# Patient Record
Sex: Female | Born: 1965 | Race: White | Hispanic: No | Marital: Married | Smoking: Never smoker
Health system: Southern US, Community
[De-identification: ages and names within clinical notes are randomized; demographics above are authoritative.]

## PROBLEM LIST (undated history)

## (undated) DIAGNOSIS — F410 Panic disorder [episodic paroxysmal anxiety] without agoraphobia: Secondary | ICD-10-CM

## (undated) DIAGNOSIS — D649 Anemia, unspecified: Secondary | ICD-10-CM

## (undated) DIAGNOSIS — J329 Chronic sinusitis, unspecified: Secondary | ICD-10-CM

## (undated) DIAGNOSIS — M1990 Unspecified osteoarthritis, unspecified site: Secondary | ICD-10-CM

## (undated) DIAGNOSIS — N809 Endometriosis, unspecified: Secondary | ICD-10-CM

## (undated) DIAGNOSIS — F329 Major depressive disorder, single episode, unspecified: Secondary | ICD-10-CM

## (undated) DIAGNOSIS — F32A Depression, unspecified: Secondary | ICD-10-CM

## (undated) HISTORY — PX: TUBAL LIGATION: SHX77

---
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
## 1998-05-02 ENCOUNTER — Encounter: Admission: RE | Admit: 1998-05-02 | Discharge: 1998-07-31 | Payer: Self-pay | Admitting: Obstetrics and Gynecology

## 1998-10-21 ENCOUNTER — Inpatient Hospital Stay (HOSPITAL_COMMUNITY): Admission: AD | Admit: 1998-10-21 | Discharge: 1998-10-21 | Payer: Self-pay | Admitting: Obstetrics and Gynecology

## 1998-12-05 ENCOUNTER — Inpatient Hospital Stay (HOSPITAL_COMMUNITY): Admission: AD | Admit: 1998-12-05 | Discharge: 1998-12-07 | Payer: Self-pay | Admitting: Gynecology

## 1999-01-15 ENCOUNTER — Other Ambulatory Visit: Admission: RE | Admit: 1999-01-15 | Discharge: 1999-01-15 | Payer: Self-pay | Admitting: Gynecology

## 1999-09-04 ENCOUNTER — Ambulatory Visit (HOSPITAL_COMMUNITY): Admission: RE | Admit: 1999-09-04 | Discharge: 1999-09-04 | Payer: Self-pay | Admitting: Gynecology

## 2000-02-18 ENCOUNTER — Other Ambulatory Visit: Admission: RE | Admit: 2000-02-18 | Discharge: 2000-02-18 | Payer: Self-pay | Admitting: Gynecology

## 2000-06-04 ENCOUNTER — Inpatient Hospital Stay (HOSPITAL_COMMUNITY): Admission: AD | Admit: 2000-06-04 | Discharge: 2000-06-04 | Payer: Self-pay | Admitting: *Deleted

## 2000-06-04 ENCOUNTER — Encounter: Payer: Self-pay | Admitting: *Deleted

## 2000-08-25 ENCOUNTER — Inpatient Hospital Stay (HOSPITAL_COMMUNITY): Admission: AD | Admit: 2000-08-25 | Discharge: 2000-08-25 | Payer: Self-pay | Admitting: Gynecology

## 2000-08-25 ENCOUNTER — Encounter: Payer: Self-pay | Admitting: Gynecology

## 2000-08-27 ENCOUNTER — Inpatient Hospital Stay (HOSPITAL_COMMUNITY): Admission: AD | Admit: 2000-08-27 | Discharge: 2000-08-29 | Payer: Self-pay | Admitting: Gynecology

## 2000-10-15 ENCOUNTER — Other Ambulatory Visit: Admission: RE | Admit: 2000-10-15 | Discharge: 2000-10-15 | Payer: Self-pay | Admitting: Gynecology

## 2002-11-08 ENCOUNTER — Other Ambulatory Visit: Admission: RE | Admit: 2002-11-08 | Discharge: 2002-11-08 | Payer: Self-pay | Admitting: Obstetrics and Gynecology

## 2003-03-16 ENCOUNTER — Encounter: Admission: RE | Admit: 2003-03-16 | Discharge: 2003-03-16 | Payer: Self-pay | Admitting: Obstetrics and Gynecology

## 2003-11-28 ENCOUNTER — Other Ambulatory Visit: Admission: RE | Admit: 2003-11-28 | Discharge: 2003-11-28 | Payer: Self-pay | Admitting: Obstetrics and Gynecology

## 2004-12-12 ENCOUNTER — Other Ambulatory Visit: Admission: RE | Admit: 2004-12-12 | Discharge: 2004-12-12 | Payer: Self-pay | Admitting: Obstetrics and Gynecology

## 2005-04-06 ENCOUNTER — Ambulatory Visit (HOSPITAL_COMMUNITY): Admission: RE | Admit: 2005-04-06 | Discharge: 2005-04-06 | Payer: Self-pay | Admitting: Obstetrics and Gynecology

## 2008-10-13 ENCOUNTER — Emergency Department: Payer: Self-pay | Admitting: Emergency Medicine

## 2011-03-10 ENCOUNTER — Ambulatory Visit: Payer: Self-pay

## 2012-03-15 ENCOUNTER — Ambulatory Visit: Payer: Self-pay

## 2014-08-30 ENCOUNTER — Other Ambulatory Visit: Payer: Self-pay | Admitting: Obstetrics and Gynecology

## 2014-08-30 DIAGNOSIS — Z1231 Encounter for screening mammogram for malignant neoplasm of breast: Secondary | ICD-10-CM

## 2014-08-31 ENCOUNTER — Ambulatory Visit
Admission: RE | Admit: 2014-08-31 | Discharge: 2014-08-31 | Disposition: A | Discharge status: Home / Self Care | Payer: BC Managed Care – PPO | Source: Ambulatory Visit | Attending: Obstetrics and Gynecology | Admitting: Obstetrics and Gynecology

## 2014-08-31 DIAGNOSIS — Z1231 Encounter for screening mammogram for malignant neoplasm of breast: Secondary | ICD-10-CM | POA: Diagnosis present

## 2015-09-25 ENCOUNTER — Other Ambulatory Visit: Payer: Self-pay | Admitting: Obstetrics and Gynecology

## 2015-09-25 DIAGNOSIS — Z1231 Encounter for screening mammogram for malignant neoplasm of breast: Secondary | ICD-10-CM

## 2015-10-17 ENCOUNTER — Ambulatory Visit: Payer: BC Managed Care – PPO

## 2015-10-21 ENCOUNTER — Ambulatory Visit
Admission: RE | Admit: 2015-10-21 | Discharge: 2015-10-21 | Disposition: A | Discharge status: Home / Self Care | Payer: BC Managed Care – PPO | Source: Ambulatory Visit | Attending: Obstetrics and Gynecology | Admitting: Obstetrics and Gynecology

## 2015-10-21 DIAGNOSIS — R928 Other abnormal and inconclusive findings on diagnostic imaging of breast: Secondary | ICD-10-CM | POA: Diagnosis not present

## 2015-10-21 DIAGNOSIS — Z1231 Encounter for screening mammogram for malignant neoplasm of breast: Secondary | ICD-10-CM | POA: Diagnosis present

## 2015-10-24 ENCOUNTER — Other Ambulatory Visit: Payer: Self-pay | Admitting: Obstetrics and Gynecology

## 2015-10-24 DIAGNOSIS — N6489 Other specified disorders of breast: Secondary | ICD-10-CM

## 2015-11-06 ENCOUNTER — Ambulatory Visit
Admission: RE | Admit: 2015-11-06 | Discharge: 2015-11-06 | Disposition: A | Discharge status: Home / Self Care | Payer: BC Managed Care – PPO | Source: Ambulatory Visit | Attending: Obstetrics and Gynecology | Admitting: Obstetrics and Gynecology

## 2015-11-06 DIAGNOSIS — N6489 Other specified disorders of breast: Secondary | ICD-10-CM

## 2016-04-12 ENCOUNTER — Emergency Department: Payer: BC Managed Care – PPO

## 2016-04-12 ENCOUNTER — Emergency Department
Admission: EM | Admit: 2016-04-12 | Discharge: 2016-04-12 | Disposition: A | Discharge status: Home / Self Care | Payer: BC Managed Care – PPO | Attending: Emergency Medicine | Admitting: Emergency Medicine

## 2016-04-12 DIAGNOSIS — R079 Chest pain, unspecified: Secondary | ICD-10-CM | POA: Insufficient documentation

## 2016-04-12 LAB — BASIC METABOLIC PANEL
ANION GAP: 5 (ref 5–15)
BUN: 14 mg/dL (ref 6–20)
CHLORIDE: 103 mmol/L (ref 101–111)
CO2: 30 mmol/L (ref 22–32)
CREATININE: 0.71 mg/dL (ref 0.44–1.00)
Calcium: 9.1 mg/dL (ref 8.9–10.3)
GFR calc non Af Amer: >60 mL/min (ref >60)
GLUCOSE: 94 mg/dL (ref 65–99)
Potassium: 3.8 mmol/L (ref 3.5–5.1)
Sodium: 138 mmol/L (ref 135–145)

## 2016-04-12 LAB — CBC
HCT: 41.6 % (ref 35.0–47.0)
HEMOGLOBIN: 14.3 g/dL (ref 12.0–16.0)
MCH: 29.6 pg (ref 26.0–34.0)
MCHC: 34.4 g/dL (ref 32.0–36.0)
MCV: 86.1 fL (ref 80.0–100.0)
Platelets: 183 10*3/uL (ref 150–440)
RBC: 4.83 MIL/uL (ref 3.80–5.20)
RDW: 13.6 % (ref 11.5–14.5)
WBC: 7.9 10*3/uL (ref 3.6–11.0)

## 2016-04-12 LAB — TROPONIN I: Troponin I: <0.03 ng/mL (ref <0.03)

## 2016-04-12 NOTE — ED Triage Notes (Signed)
Patient reports chest pain off and on since Thursday.  Reports was going to go to walk in clinic but they had closed earlier.  Tonight reports having pain to upper chest into left arm.

## 2016-04-12 NOTE — Discharge Instructions (Signed)
Please seek medical attention for any high fevers, chest pain, shortness of breath, change in behavior, persistent vomiting, bloody stool or any other new or concerning symptoms.  

## 2016-04-12 NOTE — ED Provider Notes (Signed)
[facility] Emergency Department Provider Note   ____________________________________________   I have reviewed the triage vital signs and the nursing notes.   HISTORY  Chief Complaint Chest Pain   History limited by: Not Limited   HPI Jo Lee is a 51 y.o. female who presents to the emergency department today he comes of concerns for chest pain. The chest pain started 3 days ago. Has been intermittent. It is located in the left chest. It has occasionally gone to her left arm. It has not been associated with any vomiting although she has had some nausea with it. No shortness of breath. Patient is not noticing particular pattern to the pain. She denies any fevers. Denies similar pain in the past.  No past medical history on file.  There are no active problems to display for this patient.   No past surgical history on file.  Prior to Admission medications   Not on File    Allergies Codeine and Sulfa antibiotics  No family history on file.  Social History Social History  Substance Use Topics  . Smoking status: Not on file  . Smokeless tobacco: Not on file  . Alcohol use Not on file    Review of Systems  Constitutional: Negative for fever. Cardiovascular: Negative for chest pain. Respiratory: Negative for shortness of breath. Gastrointestinal: Negative for abdominal pain, vomiting and diarrhea. Neurological: Negative for headaches, focal weakness or numbness.  10-point ROS otherwise negative.  ____________________________________________   PHYSICAL EXAM:  VITAL SIGNS: ED Triage Vitals [04/12/16 0300]  Enc Vitals Group     BP 118/76     Pulse Rate 70     Resp 20     Temp 98.4 F (36.9 C)     Temp Source Oral     SpO2 100 %     Weight 170 lb (77.1 kg)     Height  (1.753 m)   Constitutional: Alert and oriented. Well appearing and in no distress. Eyes: Conjunctivae are normal. Normal extraocular movements. ENT    Head: Normocephalic and atraumatic.   Nose: No congestion/rhinnorhea.   Mouth/Throat: Mucous membranes are moist.   Neck: No stridor. Hematological/Lymphatic/Immunilogical: No cervical lymphadenopathy. Cardiovascular: Normal rate, regular rhythm.  No murmurs, rubs, or gallops.  Respiratory: Normal respiratory effort without tachypnea nor retractions. Breath sounds are clear and equal bilaterally. No wheezes/rales/rhonchi. Gastrointestinal: Soft and non tender. No rebound. No guarding.  Genitourinary: Deferred Musculoskeletal: Normal range of motion in all extremities. No lower extremity edema. Neurologic:  Normal speech and language. No gross focal neurologic deficits are appreciated.  Skin:  Skin is warm, dry and intact. No rash noted. Psychiatric: Mood and affect are normal. Speech and behavior are normal. Patient exhibits appropriate insight and judgment.  ____________________________________________    LABS (pertinent positives/negatives)  Labs Reviewed  BASIC METABOLIC PANEL  CBC  TROPONIN I     ____________________________________________   EKG  I, Phineas Semen, attending physician, personally viewed and interpreted this EKG  EKG Time: 0302 Rate: 64 Rhythm: normal sinus rhythm Axis: normal Intervals: qtc 451 QRS: narrow ST changes: no st elevation Impression: normal ekg ____________________________________________    RADIOLOGY  CXR IMPRESSION:  No acute cardiopulmonary process seen.   ____________________________________________   PROCEDURES  Procedures  ____________________________________________   INITIAL IMPRESSION / ASSESSMENT AND PLAN / ED COURSE  Pertinent labs & imaging results that were available during my care of the patient were reviewed by me and considered in my medical decision making (see chart  for details).  Patient presented to the emergency department today with intermittent chest pain for the past 3 days. Workup  here without any concerning elevation troponin. I would expect this to be elevated if it represented ACS. Additionally chest x-ray and blood work without any other concerning findings. This point feel patient is safe for follow-up with her primary care.  ____________________________________________   FINAL CLINICAL IMPRESSION(S) / ED DIAGNOSES  Final diagnoses:  Chest pain, unspecified type     Note: This dictation was prepared with Dragon dictation. Any transcriptional errors that result from this process are unintentional     Phineas Semen, MD 04/12/16 [phone]

## 2016-09-30 ENCOUNTER — Other Ambulatory Visit: Payer: Self-pay | Admitting: Obstetrics and Gynecology

## 2016-11-05 ENCOUNTER — Other Ambulatory Visit: Payer: Self-pay | Admitting: Obstetrics and Gynecology

## 2016-11-05 DIAGNOSIS — N6320 Unspecified lump in the left breast, unspecified quadrant: Secondary | ICD-10-CM

## 2016-11-09 ENCOUNTER — Other Ambulatory Visit: Payer: Self-pay | Admitting: Obstetrics and Gynecology

## 2016-11-09 DIAGNOSIS — N6320 Unspecified lump in the left breast, unspecified quadrant: Secondary | ICD-10-CM

## 2016-11-13 ENCOUNTER — Ambulatory Visit
Admission: RE | Admit: 2016-11-13 | Discharge: 2016-11-13 | Disposition: A | Discharge status: Home / Self Care | Payer: BC Managed Care – PPO | Source: Ambulatory Visit | Attending: Obstetrics and Gynecology | Admitting: Obstetrics and Gynecology

## 2016-11-13 DIAGNOSIS — N6320 Unspecified lump in the left breast, unspecified quadrant: Secondary | ICD-10-CM

## 2016-11-13 DIAGNOSIS — R918 Other nonspecific abnormal finding of lung field: Secondary | ICD-10-CM | POA: Diagnosis not present

## 2017-11-11 ENCOUNTER — Other Ambulatory Visit: Payer: Self-pay | Admitting: Obstetrics and Gynecology

## 2017-11-11 DIAGNOSIS — Z1231 Encounter for screening mammogram for malignant neoplasm of breast: Secondary | ICD-10-CM

## 2017-12-07 ENCOUNTER — Ambulatory Visit
Admission: RE | Admit: 2017-12-07 | Discharge: 2017-12-07 | Disposition: A | Discharge status: Home / Self Care | Payer: BC Managed Care – PPO | Source: Ambulatory Visit | Attending: Obstetrics and Gynecology | Admitting: Obstetrics and Gynecology

## 2017-12-07 DIAGNOSIS — Z1231 Encounter for screening mammogram for malignant neoplasm of breast: Secondary | ICD-10-CM | POA: Insufficient documentation

## 2017-12-13 ENCOUNTER — Ambulatory Visit: Payer: Self-pay | Admitting: Urology

## 2017-12-21 ENCOUNTER — Ambulatory Visit: Payer: Self-pay | Admitting: Urology

## 2017-12-21 ENCOUNTER — Encounter: Payer: Self-pay | Admitting: Urology

## 2018-07-08 DIAGNOSIS — E538 Deficiency of other specified B group vitamins: Secondary | ICD-10-CM | POA: Insufficient documentation

## 2018-07-08 DIAGNOSIS — E559 Vitamin D deficiency, unspecified: Secondary | ICD-10-CM | POA: Insufficient documentation

## 2018-11-10 ENCOUNTER — Other Ambulatory Visit
Admission: RE | Admit: 2018-11-10 | Discharge: 2018-11-10 | Disposition: A | Discharge status: Home / Self Care | Payer: BC Managed Care – PPO | Source: Ambulatory Visit | Attending: Internal Medicine | Admitting: Internal Medicine

## 2018-11-10 ENCOUNTER — Other Ambulatory Visit: Payer: Self-pay

## 2018-11-10 DIAGNOSIS — Z20828 Contact with and (suspected) exposure to other viral communicable diseases: Secondary | ICD-10-CM | POA: Insufficient documentation

## 2018-11-10 DIAGNOSIS — Z01812 Encounter for preprocedural laboratory examination: Secondary | ICD-10-CM | POA: Insufficient documentation

## 2018-11-10 LAB — SARS CORONAVIRUS 2 (TAT 6-24 HRS): SARS Coronavirus 2: NEGATIVE

## 2018-11-11 ENCOUNTER — Encounter: Payer: Self-pay | Admitting: *Deleted

## 2018-11-14 ENCOUNTER — Ambulatory Visit: Payer: BC Managed Care – PPO | Admitting: Anesthesiology

## 2018-11-14 ENCOUNTER — Ambulatory Visit
Admission: RE | Admit: 2018-11-14 | Discharge: 2018-11-14 | Disposition: A | Discharge status: Home / Self Care | Payer: BC Managed Care – PPO | Attending: Internal Medicine | Admitting: Internal Medicine

## 2018-11-14 ENCOUNTER — Encounter
Admission: RE | Disposition: A | Discharge status: Home / Self Care | Payer: Self-pay | Source: Home / Self Care | Attending: Internal Medicine

## 2018-11-14 ENCOUNTER — Encounter: Payer: Self-pay | Admitting: *Deleted

## 2018-11-14 DIAGNOSIS — Z791 Long term (current) use of non-steroidal anti-inflammatories (NSAID): Secondary | ICD-10-CM | POA: Insufficient documentation

## 2018-11-14 DIAGNOSIS — M1990 Unspecified osteoarthritis, unspecified site: Secondary | ICD-10-CM | POA: Insufficient documentation

## 2018-11-14 DIAGNOSIS — Z885 Allergy status to narcotic agent status: Secondary | ICD-10-CM | POA: Diagnosis not present

## 2018-11-14 DIAGNOSIS — Z1211 Encounter for screening for malignant neoplasm of colon: Secondary | ICD-10-CM | POA: Insufficient documentation

## 2018-11-14 DIAGNOSIS — Z882 Allergy status to sulfonamides status: Secondary | ICD-10-CM | POA: Insufficient documentation

## 2018-11-14 HISTORY — DX: Endometriosis, unspecified: N80.9

## 2018-11-14 HISTORY — DX: Anemia, unspecified: D64.9

## 2018-11-14 HISTORY — DX: Unspecified osteoarthritis, unspecified site: M19.90

## 2018-11-14 HISTORY — DX: Panic disorder (episodic paroxysmal anxiety): F41.0

## 2018-11-14 HISTORY — PX: COLONOSCOPY WITH PROPOFOL: SHX5780

## 2018-11-14 HISTORY — DX: Chronic sinusitis, unspecified: J32.9

## 2018-11-14 HISTORY — DX: Depression, unspecified: F32.A

## 2018-11-14 SURGERY — COLONOSCOPY WITH PROPOFOL
Anesthesia: General

## 2018-11-14 MED ORDER — PROPOFOL 10 MG/ML IV BOLUS
INTRAVENOUS | Status: DC | PRN
  Administered 2018-11-14: 20 mg via INTRAVENOUS
  Administered 2018-11-14: 50 mg via INTRAVENOUS
  Administered 2018-11-14: 30 mg via INTRAVENOUS

## 2018-11-14 MED ORDER — SODIUM CHLORIDE 0.9 % IV SOLN
INTRAVENOUS | Status: DC
  Administered 2018-11-14: 1000 mL via INTRAVENOUS

## 2018-11-14 MED ORDER — PROPOFOL 500 MG/50ML IV EMUL
INTRAVENOUS | Status: DC | PRN
  Administered 2018-11-14: 175 ug/kg/min via INTRAVENOUS

## 2018-11-14 NOTE — H&P (Signed)
Outpatient short stay form Pre-procedure 11/14/2018 9:25 AM Rayan Dyal K. Alice Reichert, M.D.  Primary Physician: Ramonita Lab, M.D.  Reason for visit:  Colon cancer screening  History of present illness:  Patient presents for colonoscopy for colon cancer screening. The patient denies complaints of abdominal pain, significant change in bowel habits, or rectal bleeding.    No current facility-administered medications for this encounter.   Medications Prior to Admission  Medication Sig Dispense Refill Last Dose  . meloxicam (MOBIC) 15 MG tablet Take 15 mg by mouth daily.        Allergies  Allergen Reactions  . Codeine Other (See Comments)    dizziness  . Sulfa Antibiotics Hives     Past Medical History:  Diagnosis Date  . Anemia   . Arthritis   . Depression   . Endometriosis   . Panic attack   . Recurrent sinusitis     Review of systems:  Otherwise negative.    Physical Exam  Gen: Alert, oriented. Appears stated age.  HEENT: [place]/AT. PERRLA. Lungs: CTA, no wheezes. CV: RR nl S1, S2. Abd: soft, benign, no masses. BS+ Ext: No edema. Pulses 2+    Planned procedures: Proceed with colonoscopy. The patient understands the nature of the planned procedure, indications, risks, alternatives and potential complications including but not limited to bleeding, infection, perforation, damage to internal organs and possible oversedation/side effects from anesthesia. The patient agrees and gives consent to proceed.  Please refer to procedure notes for findings, recommendations and patient disposition/instructions.     Eragon Hammond K. Alice Reichert, M.D. Gastroenterology 11/14/2018  9:25 AM

## 2018-11-14 NOTE — Anesthesia Post-op Follow-up Note (Signed)
Anesthesia QCDR form completed.        

## 2018-11-14 NOTE — Transfer of Care (Signed)
Immediate Anesthesia Transfer of Care Note  Patient: Jo Lee  Procedure(s) Performed: COLONOSCOPY WITH PROPOFOL (N/A )  Patient Location: PACU  Anesthesia Type:General  Level of Consciousness: awake, alert  and oriented  Airway & Oxygen Therapy: Patient Spontanous Breathing and Patient connected to nasal cannula oxygen  Post-op Assessment: Report given to RN and Post -op Vital signs reviewed and stable  Post vital signs: Reviewed and stable  Last Vitals:  Vitals Value Taken Time  BP 114/74 11/14/18 1037  Temp    Pulse 69 11/14/18 1037  Resp 16 11/14/18 1038  SpO2 99 % 11/14/18 1037  Vitals shown include unvalidated device data.  Last Pain:  Vitals:   11/14/18 0944  TempSrc: Tympanic  PainSc: 0-No pain         Complications: No apparent anesthesia complications

## 2018-11-14 NOTE — Anesthesia Procedure Notes (Signed)
Date/Time: 11/14/2018 10:27 AM Performed by: Nelda Marseille, CRNA Pre-anesthesia Checklist: Patient identified, Emergency Drugs available, Suction available, Patient being monitored and Timeout performed Oxygen Delivery Method: Nasal cannula

## 2018-11-14 NOTE — Anesthesia Preprocedure Evaluation (Signed)
Anesthesia Evaluation  Patient identified by MRN, date of birth, ID band Patient awake    Reviewed: Allergy & Precautions, H&P , NPO status , Patient's Chart, lab work & pertinent test results  Airway Mallampati: II  TM Distance: >3 FB Neck ROM: full    Dental  (+) Teeth Intact   Pulmonary neg COPD, Current Smoker,           Cardiovascular negative cardio ROS       Neuro/Psych PSYCHIATRIC DISORDERS Anxiety Depression negative neurological ROS     GI/Hepatic negative GI ROS, Neg liver ROS,   Endo/Other  negative endocrine ROS  Renal/GU negative Renal ROS  negative genitourinary   Musculoskeletal   Abdominal   Peds  Hematology   Anesthesia Other Findings Past Medical History: No date: Anemia No date: Arthritis No date: Depression No date: Endometriosis No date: Panic attack No date: Recurrent sinusitis  Past Surgical History: No date: TUBAL LIGATION  BMI    Body Mass Index: 25.10 kg/m      Reproductive/Obstetrics negative OB ROS                             Anesthesia Physical Anesthesia Plan  ASA: II  Anesthesia Plan: General   Post-op Pain Management:    Induction:   PONV Risk Score and Plan: Propofol infusion and TIVA  Airway Management Planned: Natural Airway and Nasal Cannula  Additional Equipment:   Intra-op Plan:   Post-operative Plan:   Informed Consent: I have reviewed the patients History and Physical, chart, labs and discussed the procedure including the risks, benefits and alternatives for the proposed anesthesia with the patient or authorized representative who has indicated his/her understanding and acceptance.     Dental Advisory Given  Plan Discussed with: Anesthesiologist, CRNA and Surgeon  Anesthesia Plan Comments:         Anesthesia Quick Evaluation

## 2018-11-14 NOTE — Op Note (Addendum)
[facility] Gastroenterology Patient Name: Jo Lee Procedure Date: 11/14/2018 10:01 AM MRN: [id number] Account #: [id number] Date of Birth: November 17, 1965 Admit Type: Outpatient Age: 53 Room: [facility] ENDO ROOM 1 Gender: Female Note Status: Finalized Procedure:            Colonoscopy Indications:          Screening for colorectal malignant neoplasm Providers:            Boykin Nearing. Norma Fredrickson MD, MD Referring MD:         Daniel Nones, MD (Referring MD) Medicines:            Propofol per Anesthesia Complications:        No immediate complications. Procedure:            Pre-Anesthesia Assessment:                       - The risks and benefits of the procedure and the                        sedation options and risks were discussed with the                        patient. All questions were answered and informed                        consent was obtained.                       - Patient identification and proposed procedure were                        verified prior to the procedure by the nurse. The                        procedure was verified in the procedure room.                       - ASA Grade Assessment: II - A patient with mild                        systemic disease.                       - After reviewing the risks and benefits, the patient                        was deemed in satisfactory condition to undergo the                        procedure.                       After obtaining informed consent, the colonoscope was                        passed under direct vision. Throughout the procedure,                        the patient's blood pressure, pulse, and oxygen  saturations were monitored continuously. The                        Colonoscope was introduced through the anus and                        advanced to the the cecum, identified by appendiceal                        orifice and ileocecal valve. The colonoscopy was           performed without difficulty. The patient tolerated the                        procedure well. The quality of the bowel preparation                        was excellent. The ileocecal valve, appendiceal                        orifice, and rectum were photographed. Findings:      The entire examined colon appeared normal on direct and retroflexion       views. Impression:           - The entire examined colon is normal on direct and                        retroflexion views.                       - No specimens collected. Recommendation:       - Patient has a contact number available for                        emergencies. The signs and symptoms of potential                        delayed complications were discussed with the patient.                        Return to normal activities tomorrow. Written discharge                        instructions were provided to the patient.                       - Resume previous diet.                       - Continue present medications.                       - Repeat colonoscopy in 10 years for screening purposes.                       - Return to GI office PRN.                       - The findings and recommendations were discussed with                        the patient. Procedure Code(s):    ---  Professional ---                       [id number], Colorectal cancer screening; colonoscopy on                        individual not meeting criteria for high risk Diagnosis Code(s):    --- Professional ---                       Z12.11, Encounter for screening for malignant neoplasm                        of colon CPT copyright 2019 American Medical Association. All rights reserved. The codes documented in this report are preliminary and upon coder review may  be revised to meet current compliance requirements. Stanton Kidneyeodoro K Toledo MD, MD 11/14/2018 10:34:42 AM This report has been signed electronically. Number of Addenda: 0 Note Initiated On: 11/14/2018  10:01 AM Scope Withdrawal Time: 0 hours 6 minutes 50 seconds  Total Procedure Duration: 0 hours 9 minutes 56 seconds  Estimated Blood Loss: Estimated blood loss: none.      [facility]

## 2018-11-14 NOTE — Anesthesia Postprocedure Evaluation (Signed)
Anesthesia Post Note  Patient: Jo Lee  Procedure(s) Performed: COLONOSCOPY WITH PROPOFOL (N/A )  Patient location during evaluation: PACU Anesthesia Type: General Level of consciousness: awake and alert Pain management: pain level controlled Vital Signs Assessment: post-procedure vital signs reviewed and stable Respiratory status: spontaneous breathing, nonlabored ventilation and respiratory function stable Cardiovascular status: blood pressure returned to baseline and stable Postop Assessment: no apparent nausea or vomiting Anesthetic complications: no     Last Vitals:  Vitals:   11/14/18 1047 11/14/18 1057  BP: 116/76 130/83  Pulse: 62 61  Resp: 19 11  Temp:    SpO2: 100% 100%    Last Pain:  Vitals:   11/14/18 1057  TempSrc:   PainSc: 0-No pain                 Durenda Hurt

## 2018-11-14 NOTE — Interval H&P Note (Signed)
History and Physical Interval Note:  11/14/2018 9:26 AM  Jo Lee  has presented today for surgery, with the diagnosis of SCREEN.  The various methods of treatment have been discussed with the patient and family. After consideration of risks, benefits and other options for treatment, the patient has consented to  Procedure(s): COLONOSCOPY WITH PROPOFOL (N/A) as a surgical intervention.  The patient's history has been reviewed, patient examined, no change in status, stable for surgery.  I have reviewed the patient's chart and labs.  Questions were answered to the patient's satisfaction.     [place], [place]

## 2018-11-15 ENCOUNTER — Encounter: Payer: Self-pay | Admitting: Internal Medicine

## 2018-12-26 ENCOUNTER — Encounter: Payer: Self-pay | Admitting: Urology

## 2018-12-26 ENCOUNTER — Ambulatory Visit: Payer: BC Managed Care – PPO | Admitting: Urology

## 2018-12-26 ENCOUNTER — Other Ambulatory Visit: Payer: Self-pay

## 2018-12-26 VITALS — BP 142/85 | HR 69 | Ht 69.0 in | Wt 180.0 lb

## 2018-12-26 DIAGNOSIS — N3945 Continuous leakage: Secondary | ICD-10-CM

## 2018-12-26 DIAGNOSIS — N3946 Mixed incontinence: Secondary | ICD-10-CM | POA: Diagnosis not present

## 2018-12-26 LAB — BLADDER SCAN AMB NON-IMAGING

## 2018-12-26 NOTE — Progress Notes (Signed)
12/26/2018 3:12 PM   Jo Lee April 30, 1965 [id number]  Referring provider: Lynnea Ferrier, MD [address] [facility] [place],  [place] [address]  No chief complaint on file.   HPI: Was consulted to assess the patient's urine incontinence worsening over many years.  She leaks with coughing sneezing bending lifting.  She has urge incontinence.  She wears 1 pad a day moderately wet or soaked.  She has no bedwetting.  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  No neurologic issues.  No hysterectomy.  Bowel movements normal and no medication treated  Modifying factors: There are no other modifying factors  Associated signs and symptoms: There are no other associated signs and symptoms Aggravating and relieving factors: There are no other aggravating or relieving factors Severity: Moderate Duration: Persistent   PMH: Past Medical History:  Diagnosis Date  . Anemia   . Arthritis   . Depression   . Endometriosis   . Panic attack   . Recurrent sinusitis     Surgical History: Past Surgical History:  Procedure Laterality Date  . COLONOSCOPY WITH PROPOFOL N/A 11/14/2018   Procedure: COLONOSCOPY WITH PROPOFOL;  Surgeon: Toledo, Boykin Nearing, MD;  Location: [facility] ENDOSCOPY;  Service: Gastroenterology;  Laterality: N/A;  . TUBAL LIGATION      Home Medications:  Allergies as of 12/26/2018      Reactions   Codeine Other (See Comments)   dizziness   Sulfa Antibiotics Hives      Medication List       Accurate as of December 26, 2018  3:12 PM. If you have any questions, ask your nurse or doctor.        STOP taking these medications   meloxicam 15 MG tablet Commonly known as: MOBIC Stopped by: Martina Sinner, MD       Allergies:  Allergies  Allergen Reactions  .  Codeine Other (See Comments)    dizziness  . Sulfa Antibiotics Hives    Family History: No family history on file.  Social History:  reports that she has never smoked. She has never used smokeless tobacco. No history on file for alcohol and drug.  ROS: UROLOGY Frequent Urination?: Yes Hard to postpone urination?: Yes Burning/pain with urination?: Yes Get up at night to urinate?: Yes Leakage of urine?: Yes Urine stream starts and stops?: No Trouble starting stream?: No Do you have to strain to urinate?: No Blood in urine?: No Urinary tract infection?: No Sexually transmitted disease?: No Injury to kidneys or bladder?: No Painful intercourse?: No Weak stream?: No Currently pregnant?: No Vaginal bleeding?: No Last menstrual period?: n  Gastrointestinal Nausea?: No Vomiting?: No Indigestion/heartburn?: No Diarrhea?: No Constipation?: No  Constitutional Fever: No Night sweats?: No Weight loss?: No Fatigue?: No  Skin Skin rash/lesions?: No Itching?: No  Eyes Blurred vision?: No Double vision?: No  Ears/Nose/Throat Sore throat?: No Sinus problems?: No  Hematologic/Lymphatic Swollen glands?: No Easy bruising?: No  Cardiovascular Leg swelling?: No Chest pain?: No  Respiratory Cough?: No Shortness of breath?: No  Endocrine Excessive thirst?: No  Musculoskeletal Back pain?: Yes Joint pain?: Yes  Neurological Headaches?: No Dizziness?: No  Psychologic Depression?: No Anxiety?: No  Physical Exam: BP (!) 142/85   Pulse 69   Ht 5\' 9"  (1.753  m)   Wt 180 lb (81.6 kg)   BMI 26.58 kg/m   Constitutional:  Alert and oriented, No acute distress. HEENT: [place] AT, moist mucus membranes.  Trachea midline, no masses. Cardiovascular: No clubbing, cyanosis, or edema. Respiratory: Normal respiratory effort, no increased work of breathing. GI: Abdomen is soft, nontender, nondistended, no abdominal masses GU: Grade 2 hypermobility the bladder neck with  negative cough test.  Grade 1 cystocele small.  No hinge effect.  No rectocele Skin: No rashes, bruises or suspicious lesions. Lymph: No cervical or inguinal adenopathy. Neurologic: Grossly intact, no focal deficits, moving all 4 extremities. Psychiatric: Normal mood and affect.  Laboratory Data: Lab Results  Component Value Date   WBC 7.9 04/12/2016   HGB 14.3 04/12/2016   HCT 41.6 04/12/2016   MCV 86.1 04/12/2016   PLT 183 04/12/2016    Lab Results  Component Value Date   CREATININE 0.71 04/12/2016    No results found for: PSA  No results found for: TESTOSTERONE  No results found for: HGBA1C  Urinalysis No results found for: COLORURINE, APPEARANCEUR, LABSPEC, [place], GLUCOSEU, HGBUR, BILIRUBINUR, KETONESUR, PROTEINUR, UROBILINOGEN, NITRITE, LEUKOCYTESUR  Pertinent Imaging:   Assessment & Plan: Ms. milder mixed incontinence.  Role of urodynamics discussed.  1. Continuous leakage  - Urinalysis, Complete - Bladder Scan (Post Void Residual) in office   No follow-ups on file.  Reece Packer, MD  [place] [address] [place], [place] [address] [phone]

## 2018-12-27 LAB — URINALYSIS, COMPLETE
Bilirubin, UA: NEGATIVE
Glucose, UA: NEGATIVE
Ketones, UA: NEGATIVE
Leukocytes,UA: NEGATIVE
Nitrite, UA: POSITIVE — AB
Protein,UA: NEGATIVE
RBC, UA: NEGATIVE
Specific Gravity, UA: >1.03 — ABNORMAL HIGH (ref 1.005–1.030)
Urobilinogen, Ur: 0.2 mg/dL (ref 0.2–1.0)
pH, UA: 5 (ref 5.0–7.5)

## 2018-12-27 LAB — MICROSCOPIC EXAMINATION: RBC, Urine: NONE SEEN /hpf (ref 0–2)

## 2018-12-28 LAB — CULTURE, URINE COMPREHENSIVE

## 2018-12-29 ENCOUNTER — Other Ambulatory Visit: Payer: Self-pay | Admitting: *Deleted

## 2018-12-29 DIAGNOSIS — N3946 Mixed incontinence: Secondary | ICD-10-CM

## 2018-12-29 MED ORDER — NITROFURANTOIN MACROCRYSTAL 100 MG PO CAPS: 100.0000 mg | ORAL_CAPSULE | Freq: Two times a day (BID) | ORAL | 0 refills | Status: AC

## 2018-12-29 NOTE — Telephone Encounter (Signed)
Notified patient as instructed, patient pleased. Discussed follow-up appointments, patient agrees. Rx sent.Macrodantin

## 2018-12-30 ENCOUNTER — Other Ambulatory Visit: Payer: Self-pay | Admitting: Urology

## 2019-01-23 ENCOUNTER — Ambulatory Visit (INDEPENDENT_AMBULATORY_CARE_PROVIDER_SITE_OTHER): Payer: BC Managed Care – PPO | Admitting: Urology

## 2019-01-23 ENCOUNTER — Other Ambulatory Visit: Payer: Self-pay

## 2019-01-23 ENCOUNTER — Encounter: Payer: Self-pay | Admitting: Urology

## 2019-01-23 VITALS — BP 132/66 | HR 78 | Ht 69.0 in | Wt 180.0 lb

## 2019-01-23 DIAGNOSIS — N3945 Continuous leakage: Secondary | ICD-10-CM | POA: Diagnosis not present

## 2019-01-23 DIAGNOSIS — N3946 Mixed incontinence: Secondary | ICD-10-CM | POA: Diagnosis not present

## 2019-01-23 LAB — URINALYSIS, COMPLETE
Bilirubin, UA: NEGATIVE
Glucose, UA: NEGATIVE
Ketones, UA: NEGATIVE
Leukocytes,UA: NEGATIVE
Nitrite, UA: NEGATIVE
Protein,UA: NEGATIVE
RBC, UA: NEGATIVE
Specific Gravity, UA: >1.03 — ABNORMAL HIGH (ref 1.005–1.030)
Urobilinogen, Ur: 0.2 mg/dL (ref 0.2–1.0)
pH, UA: 5.5 (ref 5.0–7.5)

## 2019-01-23 LAB — MICROSCOPIC EXAMINATION: RBC, Urine: NONE SEEN /hpf (ref 0–2)

## 2019-01-23 MED ORDER — MIRABEGRON ER 50 MG PO TB24: 50.0000 mg | ORAL_TABLET | Freq: Every day | ORAL | 11 refills | Status: AC

## 2019-01-23 NOTE — Progress Notes (Signed)
01/23/2019 2:11 PM   Jo Lee 1965-04-26 [id number]  Referring provider: Lynnea Ferrier, MD [address] [facility] [place],  [place] [address]  Chief Complaint  Patient presents with  . Follow-up    HPI: Was consulted to assess the patient's urine incontinence worsening over many years.  She leaks with coughing sneezing bending lifting.  She has urge incontinence.  She wears 1 pad a day moderately wet or soaked.  She has no bedwetting.  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  Grade 1 cystocele small.  No hinge effect.  No rectocele   Urodynamics ordered for mild mixed incontinence  Today Frequency stable.  Incontinence stable During urodynamics she had an excellent flow and emptied efficiently.  She might fit the criteria as a super border.  Maximum bladder capacity was 575 mL.  Bladder was unstable reaching a pressure of 7 cm water.  She felt urgency but was able to inhibit the contractions.  She then had Valsalva induced instability reaching a pressure that caused her to leak a moderate amount.  The trigger contraction was low pressure.  Her Valsalva leak point pressure of 400 mL was 49 cm water with mild to moderate leakage.  During voiding she voided 522 mL with a maximum of 60 mils per second.  Next voiding pressure 20 cm water.  She emptied efficiently she had a double voiding pattern that was reasonable EMG activity was noted during voiding.  Bladder neck descended 2 cm.  Details of the urodynamics are signed and dictated  Patient has mixed incontinence.  Role of behavioral medical therapy discussed.  The patient does not reach her treatment goal I would recommend surgery and a urethral injectable.  Leaking a small amount without awareness could be explained by her intrinsic sphincter  deficiency but because her stress incontinence is otherwise mild it may be from overactivity  The patient says her incontinence is worsening.  She says her main symptom is leaking without awareness even sitting in the chair and the amount can vary.  She is wearing one heavier pad per day now.  In my opinion this more further supports approximately two thirds of the problem or greater bladder overactivity that she does not feel or feels as urgency.  There was explained in detail to her.  It might not respond to future surgery or injectable  She has chronic right lower quadrant discomfort and back discomfort at least for a year and saw chiropractor physical therapy and was also told she had endometriosis.  She had no pain on bladder filling and I do not think she has interstitial cystitis.  Her last urine culture was positive for E. coli.  She has not had a recent abdominal or pelvic x-ray  Her last urine culture was positive and treating it she thought helped some of her incontinence     PMH: Past Medical History:  Diagnosis Date  . Anemia   . Arthritis   . Depression   . Endometriosis   . Panic attack   . Recurrent sinusitis     Surgical History: Past Surgical History:  Procedure Laterality Date  . COLONOSCOPY WITH PROPOFOL N/A 11/14/2018   Procedure: COLONOSCOPY WITH PROPOFOL;  Surgeon: Toledo, Boykin Nearing, MD;  Location: [facility] ENDOSCOPY;  Service: Gastroenterology;  Laterality: N/A;  . TUBAL LIGATION      Home Medications:  Allergies as of 01/23/2019      Reactions   Codeine Other (See Comments)   dizziness   Sulfa Antibiotics Hives      Medication List    as of January 23, 2019  2:11 PM   You have not been prescribed any medications.     Allergies:  Allergies  Allergen Reactions  . Codeine Other (See Comments)    dizziness  . Sulfa Antibiotics Hives    Family History: No family history on file.  Social History:  reports that she has never smoked. She has never  used smokeless tobacco. No history on file for alcohol and drug.  ROS: UROLOGY Frequent Urination?: Yes Hard to postpone urination?: Yes Burning/pain with urination?: Yes Get up at night to urinate?: Yes Leakage of urine?: Yes Urine stream starts and stops?: No Trouble starting stream?: No Do you have to strain to urinate?: No Blood in urine?: No Urinary tract infection?: No Sexually transmitted disease?: No Injury to kidneys or bladder?: No Painful intercourse?: No Weak stream?: No Currently pregnant?: No Vaginal bleeding?: No Last menstrual period?: n  Gastrointestinal Nausea?: No Vomiting?: No Indigestion/heartburn?: No Diarrhea?: No Constipation?: No  Constitutional Fever: No Night sweats?: No Weight loss?: No Fatigue?: No  Skin Skin rash/lesions?: No Itching?: No  Eyes Blurred vision?: No Double vision?: No  Ears/Nose/Throat Sore throat?: No Sinus problems?: No  Hematologic/Lymphatic Swollen glands?: No Easy bruising?: No  Cardiovascular Leg swelling?: No Chest pain?: No  Respiratory Cough?: No Shortness of breath?: No  Endocrine Excessive thirst?: No  Musculoskeletal Back pain?: No Joint pain?: No  Neurological Headaches?: No Dizziness?: No  Psychologic Depression?: No Anxiety?: No  Physical Exam: BP 132/66   Pulse 78   Ht 5\' 9"  (1.753 m)   Wt 180 lb (81.6 kg)   BMI 26.58 kg/m   Constitutional:  Alert and oriented, No acute distress.  Laboratory Data: Lab Results  Component Value Date   WBC 7.9 04/12/2016   HGB 14.3 04/12/2016   HCT 41.6 04/12/2016   MCV 86.1 04/12/2016   PLT 183 04/12/2016    Lab Results  Component Value Date   CREATININE 0.71 04/12/2016    No results found for: PSA  No results found for: TESTOSTERONE  No results found for: HGBA1C  Urinalysis    Component Value Date/Time   APPEARANCEUR Hazy (A) 12/26/2018 1459   GLUCOSEU Negative 12/26/2018 1459   BILIRUBINUR Negative 12/26/2018 1459     PROTEINUR Negative 12/26/2018 1459   NITRITE Positive (A) 12/26/2018 1459   LEUKOCYTESUR Negative 12/26/2018 1459    Pertinent Imaging:   Assessment & Plan: Recommended physical therapy and Myrbetriq 50 mg samples and prescription.  Continue treatment algorithm as described above.  Call if urine culture is positive.  If it is positive I would temporarily place her on prophylaxis to see if it helps any symptoms including right lower quadrant pain.  I do not think the pain is related to the genitourinary system  Patient chose not the CT scan.  Reassess on Myrbetriq samples and prescription.  I felt she might be skeptical of my management to a certain degree but I think understands very well the probable ongoing pathophysiology of her symptoms  There are no diagnoses linked to this encounter.  No follow-ups on file.  Reece Packer, MD  [facility] [address], [place], [place] [address] [phone]  [phone]  

## 2019-01-25 ENCOUNTER — Telehealth: Payer: Self-pay | Admitting: *Deleted

## 2019-01-25 LAB — CULTURE, URINE COMPREHENSIVE

## 2019-01-25 MED ORDER — CEPHALEXIN 500 MG PO CAPS: 500.0000 mg | ORAL_CAPSULE | Freq: Three times a day (TID) | ORAL | 0 refills | Status: AC

## 2019-01-25 NOTE — Telephone Encounter (Signed)
Left message for patient to pick up RX

## 2019-01-25 NOTE — Telephone Encounter (Signed)
-----   Message from Alfredo Martinez, MD sent at 01/25/2019  9:29 AM EST ----- Keflex 500 mg 3 times a day for 7 days; tell patient she has low-grade bladder infection       ----- Message ----- From: Levada Schilling, CMA Sent: 01/25/2019   7:48 AM EST To: Alfredo Martinez, MD   ----- Message ----- From: Interface, [facility] Lab Results In Sent: 01/23/2019   4:36 PM EST To: Jennette Kettle Clinical

## 2019-01-30 ENCOUNTER — Ambulatory Visit: Payer: BC Managed Care – PPO | Admitting: Urology

## 2019-03-06 ENCOUNTER — Ambulatory Visit: Payer: Self-pay | Admitting: Urology

## 2019-04-03 ENCOUNTER — Other Ambulatory Visit: Payer: Self-pay

## 2019-04-03 ENCOUNTER — Ambulatory Visit: Payer: BC Managed Care – PPO | Admitting: Urology

## 2019-04-10 ENCOUNTER — Telehealth (INDEPENDENT_AMBULATORY_CARE_PROVIDER_SITE_OTHER): Payer: BC Managed Care – PPO | Admitting: Urology

## 2019-04-10 ENCOUNTER — Other Ambulatory Visit: Payer: Self-pay

## 2019-04-10 DIAGNOSIS — N3946 Mixed incontinence: Secondary | ICD-10-CM | POA: Diagnosis not present

## 2019-04-10 NOTE — Progress Notes (Signed)
Virtual Visit via Telephone Note  I connected with Jo Lee on 04/10/19 at  8:00 AM EDT by telephone and verified that I am speaking with the correct person using two identifiers.   I discussed the limitations, risks, security and privacy concerns of performing an evaluation and management service by telephone and the availability of in person appointments. We discussed the impact of the COVID-19 pandemic on the healthcare system, and the importance of social distancing and reducing patient and provider exposure. I also discussed with the patient that there may be a patient responsible charge related to this service. The patient expressed understanding and agreed to proceed.  Reason for visit: Patient has mixed incontinence.  She leaks without awareness sitting in a chair.  I reviewed her chart in detail.  She was placed on Myrbetriq.  She was skeptical of my recommendations during the last visit.  History of Present Illness: Incontinence dramatically better.  More than 70% improved.  Still leaks with sneezing and tripping.  She wears 1 liner a day that is damp or dry.  She no longer has nocturia or urgency and no longer leaks without awareness and in a chair.  Clinically not infected  Assessment and Plan: Continue with Myrbetriq 50 mg and reassess in 7 weeks  Follow Up: We will assess in 7 weeks    I discussed the assessment and treatment plan with the patient. The patient was provided an opportunity to ask questions and all were answered. The patient agreed with the plan and demonstrated an understanding of the instructions.   The patient was advised to call back or seek an in-person evaluation if the symptoms worsen or if the condition fails to improve as anticipated.  I provided 12 minutes of non-face-to-face time during this encounter.   Martina Sinner, MD

## 2019-05-29 ENCOUNTER — Ambulatory Visit: Payer: Self-pay | Admitting: Urology

## 2019-06-05 ENCOUNTER — Ambulatory Visit: Payer: Self-pay | Admitting: Urology

## 2019-06-19 ENCOUNTER — Ambulatory Visit: Payer: Self-pay | Admitting: Urology

## 2019-06-28 ENCOUNTER — Other Ambulatory Visit: Payer: Self-pay

## 2019-06-28 ENCOUNTER — Telehealth: Payer: Self-pay

## 2019-06-28 ENCOUNTER — Encounter: Payer: Self-pay | Admitting: Physician Assistant

## 2019-06-28 ENCOUNTER — Ambulatory Visit (INDEPENDENT_AMBULATORY_CARE_PROVIDER_SITE_OTHER): Payer: BC Managed Care – PPO | Admitting: Physician Assistant

## 2019-06-28 VITALS — BP 138/87 | HR 62 | Ht 69.0 in | Wt 179.0 lb

## 2019-06-28 DIAGNOSIS — R300 Dysuria: Secondary | ICD-10-CM

## 2019-06-28 NOTE — Progress Notes (Signed)
06/28/2019 4:05 PM   Jo Lee 1966-01-05 [id number]  CC: Chief Complaint  Patient presents with  . Urinary Tract Infection    HPI: Jo Lee is a 54 y.o. female with PMH mixed incontinence on Myrbetriq 50 mg daily and chronic RLQ and back pain due to endometriosis who presents today for evaluation of possible UTI. She is an established [facility] patient who last saw Dr. Sherron Monday on 04/10/2019 for symptom recheck on Myrbetriq.  Today, patient reports a 1 day history of urinary urgency and dysuria.  She also reports stress associated with her husband's health and a recent move and states she has not been well-hydrated secondary to these.  In-office UA today positive for trace-intact blood; urine microscopy with 11-30 WBCs/HPF.  PMH: Past Medical History:  Diagnosis Date  . Anemia   . Arthritis   . Depression   . Endometriosis   . Panic attack   . Recurrent sinusitis     Surgical History: Past Surgical History:  Procedure Laterality Date  . COLONOSCOPY WITH PROPOFOL N/A 11/14/2018   Procedure: COLONOSCOPY WITH PROPOFOL;  Surgeon: Toledo, Boykin Nearing, MD;  Location: [facility] ENDOSCOPY;  Service: Gastroenterology;  Laterality: N/A;  . TUBAL LIGATION      Home Medications:  Allergies as of 06/28/2019      Reactions   Codeine Other (See Comments)   dizziness   Sulfa Antibiotics Hives      Medication List       Accurate as of June 28, 2019  4:05 PM. If you have any questions, ask your nurse or doctor.        mirabegron ER 50 MG Tb24 tablet Commonly known as: MYRBETRIQ Take 1 tablet (50 mg total) by mouth daily.       Allergies:  Allergies  Allergen Reactions  . Codeine Other (See Comments)    dizziness  . Sulfa Antibiotics Hives    Family History: No family history on file.  Social History:   reports that she has never smoked. She has never used smokeless tobacco. No history on file for alcohol use and drug use.  Physical Exam: BP 138/87   Pulse 62    Ht 5\' 9"  (1.753 m)   Wt 179 lb (81.2 kg)   BMI 26.43 kg/m   Constitutional:  Alert and oriented, no acute distress, nontoxic appearing HEENT: [place], AT Cardiovascular: No clubbing, cyanosis, or edema Respiratory: Normal respiratory effort, no increased work of breathing Skin: No rashes, bruises or suspicious lesions Neurologic: Grossly intact, no focal deficits, moving all 4 extremities Psychiatric: Normal mood and affect  Laboratory Data: Results for orders placed or performed in visit on 06/28/19  CULTURE, URINE COMPREHENSIVE   Specimen: Urine   UR  Result Value Ref Range   Urine Culture, Comprehensive Final report (A)    Organism ID, Bacteria Citrobacter koseri (A)    ANTIMICROBIAL SUSCEPTIBILITY Comment   Microscopic Examination   Urine  Result Value Ref Range   WBC, UA 11-30 (A) 0 - 5 /hpf   RBC 0-2 0 - 2 /hpf   Epithelial Cells (non renal) 0-10 0 - 10 /hpf   Bacteria, UA Few None seen/Few  Urinalysis, Complete  Result Value Ref Range   Specific Gravity, UA 1.020 1.005 - 1.030   pH, UA 7.0 5.0 - 7.5   Color, UA Yellow Yellow   Appearance Ur Hazy (A) Clear   Leukocytes,UA Negative Negative   Protein,UA Negative Negative/Trace   Glucose, UA Negative Negative  Ketones, UA Negative Negative   RBC, UA Trace (A) Negative   Bilirubin, UA Negative Negative   Urobilinogen, Ur 0.2 0.2 - 1.0 mg/dL   Nitrite, UA Negative Negative   Microscopic Examination See below:    Assessment & Plan:   1. Dysuria UA with mild pyuria, unconvincing for UTI.  Will send for culture for further evaluation.  Counseled patient to stay well-hydrated and use Azo for symptom relief while we await urine culture results.  She expressed understanding. - Urinalysis, Complete - CULTURE, URINE COMPREHENSIVE   Return if symptoms worsen or fail to improve.  Debroah Loop, PA-C  [facility] [address], [place], [place] [address] [phone]

## 2019-06-28 NOTE — Telephone Encounter (Signed)
Patient called today stating that she is having UTI symptoms of dysuria, frequency and urgency. She was added on to PA schedule today for further evaluation

## 2019-06-28 NOTE — Patient Instructions (Addendum)
Stay well hydrated and start Azo urinary pain reliever over the next two days. I will call you Friday with your urine results. Call our office if you start to feel worse rather than better in the meantime.

## 2019-06-30 ENCOUNTER — Telehealth: Payer: Self-pay | Admitting: Physician Assistant

## 2019-06-30 MED ORDER — NITROFURANTOIN MONOHYD MACRO 100 MG PO CAPS: 100.0000 mg | ORAL_CAPSULE | Freq: Two times a day (BID) | ORAL | 0 refills | Status: DC

## 2019-06-30 NOTE — Telephone Encounter (Signed)
Patient's urine culture from earlier this week has come back preliminary positive for UTI.  I would like to start her on empiric Macrobid 100 mg twice daily x5 days.  Called patient to confirm preferred pharmacy, [facility] to return my call.

## 2019-06-30 NOTE — Telephone Encounter (Signed)
Patient returned call, informed results. Voiced understanding. Sent in RX to [facility].

## 2019-07-01 LAB — CULTURE, URINE COMPREHENSIVE

## 2019-07-03 LAB — URINALYSIS, COMPLETE
Bilirubin, UA: NEGATIVE
Glucose, UA: NEGATIVE
Ketones, UA: NEGATIVE
Leukocytes,UA: NEGATIVE
Nitrite, UA: NEGATIVE
Protein,UA: NEGATIVE
Specific Gravity, UA: 1.02 (ref 1.005–1.030)
Urobilinogen, Ur: 0.2 mg/dL (ref 0.2–1.0)
pH, UA: 7 (ref 5.0–7.5)

## 2019-07-03 LAB — MICROSCOPIC EXAMINATION

## 2019-07-10 ENCOUNTER — Ambulatory Visit: Payer: Self-pay | Admitting: Urology

## 2019-07-21 ENCOUNTER — Ambulatory Visit (INDEPENDENT_AMBULATORY_CARE_PROVIDER_SITE_OTHER): Payer: BC Managed Care – PPO | Admitting: Urology

## 2019-07-21 ENCOUNTER — Encounter: Payer: Self-pay | Admitting: Urology

## 2019-07-21 ENCOUNTER — Ambulatory Visit
Admission: RE | Admit: 2019-07-21 | Discharge: 2019-07-21 | Disposition: A | Discharge status: Home / Self Care | Payer: BC Managed Care – PPO | Source: Ambulatory Visit | Attending: Urology | Admitting: Urology

## 2019-07-21 ENCOUNTER — Other Ambulatory Visit: Payer: Self-pay

## 2019-07-21 ENCOUNTER — Ambulatory Visit
Admission: RE | Admit: 2019-07-21 | Discharge: 2019-07-21 | Disposition: A | Discharge status: Home / Self Care | Payer: BC Managed Care – PPO | Attending: Urology | Admitting: Urology

## 2019-07-21 VITALS — BP 132/83 | HR 57 | Ht 69.0 in | Wt 180.5 lb

## 2019-07-21 DIAGNOSIS — M545 Low back pain, unspecified: Secondary | ICD-10-CM

## 2019-07-21 DIAGNOSIS — R109 Unspecified abdominal pain: Secondary | ICD-10-CM | POA: Diagnosis present

## 2019-07-21 DIAGNOSIS — R300 Dysuria: Secondary | ICD-10-CM

## 2019-07-21 LAB — URINALYSIS, COMPLETE
Bilirubin, UA: NEGATIVE
Glucose, UA: NEGATIVE
Ketones, UA: NEGATIVE
Nitrite, UA: POSITIVE — AB
Protein,UA: NEGATIVE
Specific Gravity, UA: 1.02 (ref 1.005–1.030)
Urobilinogen, Ur: 0.2 mg/dL (ref 0.2–1.0)
pH, UA: 6.5 (ref 5.0–7.5)

## 2019-07-21 LAB — MICROSCOPIC EXAMINATION

## 2019-07-21 MED ORDER — NITROFURANTOIN MACROCRYSTAL 100 MG PO CAPS: 100.0000 mg | ORAL_CAPSULE | Freq: Two times a day (BID) | ORAL | 0 refills | Status: DC

## 2019-07-21 MED ORDER — NITROFURANTOIN MACROCRYSTAL 100 MG PO CAPS: 100.0000 mg | ORAL_CAPSULE | Freq: Two times a day (BID) | ORAL | 0 refills | Status: AC

## 2019-07-21 NOTE — Patient Instructions (Signed)

## 2019-07-21 NOTE — Progress Notes (Signed)
07/21/2019 11:13 AM   Marguarite Arbour Mar 05, 1965 [id number]  Referring provider: Lynnea Ferrier, MD [address] [facility] [place],  [place] [address]  Chief Complaint  Patient presents with  . Dysuria    HPI:  Jo Lee presents today with left lower back pain. Because of the back pain she underwent a KUB which unofficially appears benign without stone.  Moderate fecal impaction.  Official read pending. She is AFVSS. She takes Azo, cranberry and if needed. She has frequency and urgency. No dysuria. She has nausea. She has mod bacteria 6-10 rbc. 0-2 rbc. Pos Nit, trace LE. She was seen with dysuria a few weeks ago and treated with hydration and Azo.  Culture grew 50-100,000 pansensitive Citrobacter.   She has a history of mixed incontinence on Myrbetriq.  She has chronic right lower quadrant pain and back pain due to endometriosis.   PMH: Past Medical History:  Diagnosis Date  . Anemia   . Arthritis   . Depression   . Endometriosis   . Panic attack   . Recurrent sinusitis     Surgical History: Past Surgical History:  Procedure Laterality Date  . COLONOSCOPY WITH PROPOFOL N/A 11/14/2018   Procedure: COLONOSCOPY WITH PROPOFOL;  Surgeon: Toledo, Boykin Nearing, MD;  Location: [facility] ENDOSCOPY;  Service: Gastroenterology;  Laterality: N/A;  . TUBAL LIGATION      Home Medications:  Allergies as of 07/21/2019      Reactions   Codeine Other (See Comments)   dizziness   Sulfa Antibiotics Hives      Medication List       Accurate as of July 21, 2019 11:13 AM. If you have any questions, ask your nurse or doctor.        mirabegron ER 50 MG Tb24 tablet Commonly known as: MYRBETRIQ Take 1 tablet (50 mg total) by mouth daily.   nitrofurantoin (macrocrystal-monohydrate) 100 MG capsule Commonly known as: Macrobid Take 1 capsule (100 mg total) by mouth 2 (two) times daily.       Allergies:  Allergies  Allergen Reactions  . Codeine Other (See Comments)     dizziness  . Sulfa Antibiotics Hives    Family History: No family history on file.  Social History:  reports that she has never smoked. She has never used smokeless tobacco. No history on file for alcohol use and drug use.   Physical Exam: BP 132/83 (BP Location: Left Arm, Patient Position: Sitting, Cuff Size: Large)   Pulse (!) 57   Ht 5\' 9"  (1.753 m)   Wt 180 lb 8 oz (81.9 kg)   BMI 26.66 kg/m   Constitutional:  Alert and oriented, No acute distress. HEENT: [place] AT, moist mucus membranes.  Trachea midline, no masses. Cardiovascular: No clubbing, cyanosis, or edema. Respiratory: Normal respiratory effort, no increased work of breathing. GI: Abdomen is soft, nontender, nondistended, no abdominal masses GU: No CVA tenderness Skin: No rashes, bruises or suspicious lesions. Neurologic: Grossly intact, no focal deficits, moving all 4 extremities. Psychiatric: Normal mood and affect.  Laboratory Data: Lab Results  Component Value Date   WBC 7.9 04/12/2016   HGB 14.3 04/12/2016   HCT 41.6 04/12/2016   MCV 86.1 04/12/2016   PLT 183 04/12/2016    Lab Results  Component Value Date   CREATININE 0.71 04/12/2016    No results found for: PSA  No results found for: TESTOSTERONE  No results found for: HGBA1C  Urinalysis    Component Value Date/Time  APPEARANCEUR Hazy (A) 06/28/2019 1127   GLUCOSEU Negative 06/28/2019 1127   BILIRUBINUR Negative 06/28/2019 1127   PROTEINUR Negative 06/28/2019 1127   NITRITE Negative 06/28/2019 1127   LEUKOCYTESUR Negative 06/28/2019 1127    Lab Results  Component Value Date   LABMICR See below: 06/28/2019   WBCUA 11-30 (A) 06/28/2019   LABEPIT 0-10 06/28/2019   MUCUS Present (A) 01/23/2019   BACTERIA Few 06/28/2019    Pertinent Imaging: N/a No results found for this or any previous visit.  No results found for this or any previous visit.  No results found for this or any previous visit.  No results found for this or any  previous visit.  No results found for this or any previous visit.  No results found for this or any previous visit.  No results found for this or any previous visit.  No results found for this or any previous visit.   Assessment & Plan:    1. Freq, urge  - continue Myrbetriq.   - Urinalysis, Complete  2. UTI - urine for culture - Will tx with short course of NF and have her see Dr. Clint Lipps in a couple of weeks for recheck and consider imaging or cysto if not done.   3. Back pain - again vitals stable. She looks well - doubt pyelo. She should see her PCP if continues or ED if worsens. Likely not related to a UTI. Again, official read pending on KUB.    No follow-ups on file.  Jerilee Field, MD  [facility] [address] [place], [place] [address] [phone]

## 2019-07-24 ENCOUNTER — Telehealth: Payer: Self-pay

## 2019-07-24 NOTE — Telephone Encounter (Signed)
-----   Message from Jerilee Field, MD sent at 07/24/2019 11:12 AM EDT ----- Regarding: FW: Jo Lee, Please let [name] know the radiologist read her KUB and reported as normal. No stone was seen. She did have some constipation or large stool burden. If her pain changes or if she has any blood in her urine please let Dr. Sherron Monday know about it.  Dr Bea Laura ----- Message ----- From: Debbe Bales, CMA Sent: 07/24/2019   8:48 AM EDT To: Jerilee Field, MD   ----- Message ----- From: Leory Plowman, Rad Results In Sent: 07/21/2019   9:59 PM EDT To: Debbe Bales, CMA

## 2019-07-24 NOTE — Telephone Encounter (Signed)
LMOM informing patient of KUB results. Reminded patient to call if she has anymore pain or blood in her urine.

## 2019-07-31 ENCOUNTER — Ambulatory Visit (INDEPENDENT_AMBULATORY_CARE_PROVIDER_SITE_OTHER): Payer: BC Managed Care – PPO | Admitting: Urology

## 2019-07-31 ENCOUNTER — Other Ambulatory Visit: Payer: Self-pay

## 2019-07-31 ENCOUNTER — Ambulatory Visit: Payer: Self-pay | Admitting: Urology

## 2019-07-31 VITALS — BP 127/85 | HR 67

## 2019-07-31 DIAGNOSIS — R300 Dysuria: Secondary | ICD-10-CM | POA: Diagnosis not present

## 2019-07-31 DIAGNOSIS — N3946 Mixed incontinence: Secondary | ICD-10-CM

## 2019-07-31 LAB — URINALYSIS, COMPLETE
Bilirubin, UA: NEGATIVE
Glucose, UA: NEGATIVE
Ketones, UA: NEGATIVE
Leukocytes,UA: NEGATIVE
Nitrite, UA: NEGATIVE
Protein,UA: NEGATIVE
RBC, UA: NEGATIVE
Specific Gravity, UA: 1.02 (ref 1.005–1.030)
Urobilinogen, Ur: 0.2 mg/dL (ref 0.2–1.0)
pH, UA: 6.5 (ref 5.0–7.5)

## 2019-07-31 LAB — MICROSCOPIC EXAMINATION
Bacteria, UA: NONE SEEN
WBC, UA: NONE SEEN /hpf (ref 0–5)

## 2019-07-31 MED ORDER — NITROFURANTOIN MACROCRYSTAL 100 MG PO CAPS: 100.0000 mg | ORAL_CAPSULE | Freq: Every day | ORAL | 11 refills | Status: AC

## 2019-07-31 NOTE — Progress Notes (Signed)
07/31/2019 1:20 PM   Marguarite Arbour Aug 30, 1965 [id number]  Referring provider: Lynnea Ferrier, MD [address] [facility] [place],  [place] [address]  Chief Complaint  Patient presents with  . Nephrolithiasis    HPI: Was consulted to assess the patient's urine incontinence worsening over many years. She leaks with coughing sneezing bending lifting. She has urge incontinence. She wears 1 pad a day moderately wet or soaked. She has no bedwetting. 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 Grade 1 cystocele small.   During urodynamics she had an excellent flow and emptied efficiently.  She might fit the criteria as a super voider.  Maximum bladder capacity was 575 mL.  Bladder was unstable reaching a pressure of 7 cm water.  She felt urgency but was able to inhibit the contractions.  She then had Valsalva induced instability reaching a pressure that caused her to leak a moderate amount.  The trigger contraction was low pressure.  Her Valsalva leak point pressure of 400 mL was 49 cm water with mild to moderate leakage.  During voiding she voided 522 mL with a maximum of 60 mils per second.  Next voiding pressure 20 cm water.  She emptied efficiently she had a double voiding pattern that was reasonable EMG activity was noted during voiding.  Bladder neck descended 2 cm.    Patient has mixed incontinence.  Role of behavioral medical therapy discussed.  The patient does not reach her treatment goal I would recommend surgery and a urethral injectable.  Leaking a small amount without awareness could be explained by her intrinsic sphincter deficiency but because her stress incontinence is otherwise mild it may be from overactivity  The patient says her incontinence is worsening.  She says her main symptom  is leaking without awareness even sitting in the chair and the amount can vary.  She is wearing one heavier pad per day now.  In my opinion this more further supports approximately two thirds of the problem or greater bladder overactivity that she does not feel or feels as urgency.  There was explained in detail to her.  It might not respond to future surgery or injectable  She has chronic right lower quadrant discomfort and back discomfort at least for a year and saw chiropractor physical therapy and was also told she had endometriosis.  She had no pain on bladder filling and I do not think she has interstitial cystitis.  Her last urine culture was positive for E. coli.    Her last urine culture was positive and treating it she thought helped some of her incontinence  Recommended physical therapy and Myrbetriq 50 mg samples and prescription.  Continue treatment algorithm as described above.  Call if urine culture is positive.  If it is positive I would temporarily place her on prophylaxis to see if it helps any symptoms including right lower quadrant pain.  I do not think the pain is related to the genitourinary system  Patient chose not the CT scan.  Reassess on Myrbetriq samples and prescription.  I felt she might be skeptical of my management to a certain degree but I think understands very well the probable ongoing pathophysiology of her symptoms  Today I did a teleconference I member weeks ago and she  was 70% better.  Incontinence dramatically improved.  She still would leak with sneezing and tripping wearing 1 liner a day damp or dry.  She no longer would leak without awareness or sitting in a chair.  On review of medical records patient has had 2+ urine cultures in the last 6 months and is been seen twice since I saw her.  She presents with cystitis symptoms  When the patient saw my partner with cystitis-like symptoms they resolved with 3 days antibiotics.  She was having left flank pain  and diffuse low back pain and the KUB was normal.  No other x-ray obtained.  She does have confirmed endometriosis with back pain.  She is known to be constipated  She thinks she has had 6 bladder infections in the last year.  She says the Myrbetriq has made her 80% better but she still leaks.  She can leak with intercourse for the last 2 or 3 years.  It was nonspecific.  She thinks urine is coming from her vagina but it was nonspecific.  She also can leak with intercourse          PMH: Past Medical History:  Diagnosis Date  . Anemia   . Arthritis   . Depression   . Endometriosis   . Panic attack   . Recurrent sinusitis     Surgical History: Past Surgical History:  Procedure Laterality Date  . COLONOSCOPY WITH PROPOFOL N/A 11/14/2018   Procedure: COLONOSCOPY WITH PROPOFOL;  Surgeon: Toledo, Boykin Nearing, MD;  Location: [facility] ENDOSCOPY;  Service: Gastroenterology;  Laterality: N/A;  . TUBAL LIGATION      Home Medications:  Allergies as of 07/31/2019      Reactions   Codeine Other (See Comments)   dizziness   Sulfa Antibiotics Hives      Medication List       Accurate as of July 31, 2019  1:20 PM. If you have any questions, ask your nurse or doctor.        STOP taking these medications   nitrofurantoin (macrocrystal-monohydrate) 100 MG capsule Commonly known as: Macrobid Stopped by: Martina Sinner, MD     TAKE these medications   mirabegron ER 50 MG Tb24 tablet Commonly known as: MYRBETRIQ Take 1 tablet (50 mg total) by mouth daily.       Allergies:  Allergies  Allergen Reactions  . Codeine Other (See Comments)    dizziness  . Sulfa Antibiotics Hives    Family History: No family history on file.  Social History:  reports that she has never smoked. She has never used smokeless tobacco. No history on file for alcohol use and drug use.  ROS:                                        Physical Exam: BP 127/85   Pulse 67     Constitutional:  Alert and oriented, No acute distress.   Laboratory Data: Lab Results  Component Value Date   WBC 7.9 04/12/2016   HGB 14.3 04/12/2016   HCT 41.6 04/12/2016   MCV 86.1 04/12/2016   PLT 183 04/12/2016    Lab Results  Component Value Date   CREATININE 0.71 04/12/2016    No results found for: PSA  No results found for: TESTOSTERONE  No results found for: HGBA1C  Urinalysis    Component Value Date/Time   APPEARANCEUR Cloudy (  A) 07/21/2019 1044   GLUCOSEU Negative 07/21/2019 1044   BILIRUBINUR Negative 07/21/2019 1044   PROTEINUR Negative 07/21/2019 1044   NITRITE Positive (A) 07/21/2019 1044   LEUKOCYTESUR Trace (A) 07/21/2019 1044    Pertinent Imaging:   Assessment & Plan: Patient has mixed incontinence.  I appreciate she has high treatment goals from the outset but am not convinced that surgery will address a lot of her issues.  She understands it will not change the natural history of her bladder infections.  It likely will not help leakage due to overactivity which probably is the leakage during intercourse and sitting in the chair.  I do not think she has interstitial cystitis.  The role of a CT scan for more diffuse symptoms discussed.  The role of urinary prophylaxis and having reasonable treatment goals discussed.  A bulking agent is some to consider in the future as well.  Patient will be reassessed in 8 weeks on Macrodantin prophylaxis.  She will stay on the Myrbetriq.  I told her I will discuss a bulking agent as an option at that time.  She understands we do not have likely 1 treatment for all of her issues.  Her husband just had a heart attack and he was young and healthy at age 67.  Hopefully he will be doing well.  There are no diagnoses linked to this encounter.  No follow-ups on file.  Martina Sinner, MD  [facility] [address] [place], [place] [address] [phone]

## 2019-08-04 LAB — CULTURE, URINE COMPREHENSIVE

## 2019-09-25 ENCOUNTER — Ambulatory Visit: Payer: Self-pay | Admitting: Urology

## 2020-05-06 ENCOUNTER — Other Ambulatory Visit: Payer: Self-pay | Admitting: Internal Medicine

## 2020-05-06 DIAGNOSIS — Z1231 Encounter for screening mammogram for malignant neoplasm of breast: Secondary | ICD-10-CM

## 2020-05-31 ENCOUNTER — Other Ambulatory Visit: Payer: Self-pay

## 2020-05-31 ENCOUNTER — Ambulatory Visit
Admission: RE | Admit: 2020-05-31 | Discharge: 2020-05-31 | Disposition: A | Discharge status: Home / Self Care | Payer: BC Managed Care – PPO | Source: Ambulatory Visit | Attending: Internal Medicine | Admitting: Internal Medicine

## 2020-05-31 DIAGNOSIS — Z1231 Encounter for screening mammogram for malignant neoplasm of breast: Secondary | ICD-10-CM | POA: Diagnosis not present

## 2021-08-11 ENCOUNTER — Other Ambulatory Visit: Payer: Self-pay | Admitting: Internal Medicine

## 2021-08-11 DIAGNOSIS — Z1231 Encounter for screening mammogram for malignant neoplasm of breast: Secondary | ICD-10-CM

## 2021-09-26 ENCOUNTER — Other Ambulatory Visit: Payer: Self-pay | Admitting: Obstetrics and Gynecology

## 2021-09-26 DIAGNOSIS — R928 Other abnormal and inconclusive findings on diagnostic imaging of breast: Secondary | ICD-10-CM

## 2021-10-06 ENCOUNTER — Ambulatory Visit
Admission: RE | Admit: 2021-10-06 | Discharge: 2021-10-06 | Disposition: A | Discharge status: Home / Self Care | Payer: BC Managed Care – PPO | Source: Ambulatory Visit | Attending: Obstetrics and Gynecology | Admitting: Obstetrics and Gynecology

## 2021-10-06 ENCOUNTER — Other Ambulatory Visit: Payer: Self-pay | Admitting: Obstetrics and Gynecology

## 2021-10-06 DIAGNOSIS — R921 Mammographic calcification found on diagnostic imaging of breast: Secondary | ICD-10-CM

## 2021-10-06 DIAGNOSIS — R928 Other abnormal and inconclusive findings on diagnostic imaging of breast: Secondary | ICD-10-CM

## 2021-10-14 ENCOUNTER — Ambulatory Visit
Admission: RE | Admit: 2021-10-14 | Discharge: 2021-10-14 | Disposition: A | Discharge status: Home / Self Care | Payer: BC Managed Care – PPO | Source: Ambulatory Visit | Attending: Obstetrics and Gynecology | Admitting: Obstetrics and Gynecology

## 2021-10-14 DIAGNOSIS — R921 Mammographic calcification found on diagnostic imaging of breast: Secondary | ICD-10-CM

## 2021-10-17 HISTORY — PX: BREAST BIOPSY: SHX20

## 2022-01-25 ENCOUNTER — Emergency Department (HOSPITAL_COMMUNITY): Payer: BC Managed Care – PPO

## 2022-01-25 ENCOUNTER — Other Ambulatory Visit: Payer: Self-pay

## 2022-01-25 ENCOUNTER — Encounter (HOSPITAL_COMMUNITY): Payer: Self-pay

## 2022-01-25 ENCOUNTER — Inpatient Hospital Stay (HOSPITAL_COMMUNITY)
Admission: EM | Admit: 2022-01-25 | Discharge: 2022-01-26 | DRG: 419 | Disposition: A | Discharge status: Home / Self Care | Payer: BC Managed Care – PPO | Attending: Surgery | Admitting: Surgery

## 2022-01-25 DIAGNOSIS — F32A Depression, unspecified: Secondary | ICD-10-CM | POA: Diagnosis present

## 2022-01-25 DIAGNOSIS — Z885 Allergy status to narcotic agent status: Secondary | ICD-10-CM | POA: Diagnosis not present

## 2022-01-25 DIAGNOSIS — Z882 Allergy status to sulfonamides status: Secondary | ICD-10-CM | POA: Diagnosis not present

## 2022-01-25 DIAGNOSIS — K819 Cholecystitis, unspecified: Principal | ICD-10-CM

## 2022-01-25 DIAGNOSIS — Z9851 Tubal ligation status: Secondary | ICD-10-CM

## 2022-01-25 DIAGNOSIS — N809 Endometriosis, unspecified: Secondary | ICD-10-CM | POA: Diagnosis present

## 2022-01-25 DIAGNOSIS — F410 Panic disorder [episodic paroxysmal anxiety] without agoraphobia: Secondary | ICD-10-CM | POA: Diagnosis present

## 2022-01-25 DIAGNOSIS — D649 Anemia, unspecified: Secondary | ICD-10-CM | POA: Diagnosis present

## 2022-01-25 DIAGNOSIS — M1990 Unspecified osteoarthritis, unspecified site: Secondary | ICD-10-CM | POA: Diagnosis present

## 2022-01-25 DIAGNOSIS — J329 Chronic sinusitis, unspecified: Secondary | ICD-10-CM | POA: Diagnosis present

## 2022-01-25 DIAGNOSIS — K8012 Calculus of gallbladder with acute and chronic cholecystitis without obstruction: Principal | ICD-10-CM | POA: Diagnosis present

## 2022-01-25 DIAGNOSIS — Z79899 Other long term (current) drug therapy: Secondary | ICD-10-CM

## 2022-01-25 DIAGNOSIS — K8000 Calculus of gallbladder with acute cholecystitis without obstruction: Secondary | ICD-10-CM | POA: Diagnosis present

## 2022-01-25 LAB — COMPREHENSIVE METABOLIC PANEL
ALT: 40 U/L (ref 0–44)
AST: 27 U/L (ref 15–41)
Albumin: 4.3 g/dL (ref 3.5–5.0)
Alkaline Phosphatase: 72 U/L (ref 38–126)
Anion gap: 11 (ref 5–15)
BUN: 13 mg/dL (ref 6–20)
CO2: 27 mmol/L (ref 22–32)
Calcium: 9.3 mg/dL (ref 8.9–10.3)
Chloride: 99 mmol/L (ref 98–111)
Creatinine, Ser: 0.72 mg/dL (ref 0.44–1.00)
GFR, Estimated: >60 mL/min (ref >60)
Glucose, Bld: 131 mg/dL — ABNORMAL HIGH (ref 70–99)
Potassium: 4.3 mmol/L (ref 3.5–5.1)
Sodium: 137 mmol/L (ref 135–145)
Total Bilirubin: 0.6 mg/dL (ref 0.3–1.2)
Total Protein: 6.9 g/dL (ref 6.5–8.1)

## 2022-01-25 LAB — URINALYSIS, ROUTINE W REFLEX MICROSCOPIC
Bacteria, UA: NONE SEEN
Bilirubin Urine: NEGATIVE
Glucose, UA: NEGATIVE mg/dL
Hgb urine dipstick: NEGATIVE
Ketones, ur: NEGATIVE mg/dL
Leukocytes,Ua: NEGATIVE
Nitrite: NEGATIVE
Protein, ur: 30 mg/dL — AB
Specific Gravity, Urine: 1.018 (ref 1.005–1.030)
pH: 8 (ref 5.0–8.0)

## 2022-01-25 LAB — CBC
HCT: 40.3 % (ref 36.0–46.0)
Hemoglobin: 13.5 g/dL (ref 12.0–15.0)
MCH: 29.5 pg (ref 26.0–34.0)
MCHC: 33.5 g/dL (ref 30.0–36.0)
MCV: 88 fL (ref 80.0–100.0)
Platelets: 173 10*3/uL (ref 150–400)
RBC: 4.58 MIL/uL (ref 3.87–5.11)
RDW: 12.4 % (ref 11.5–15.5)
WBC: 9.1 10*3/uL (ref 4.0–10.5)
nRBC: 0 % (ref 0.0–0.2)

## 2022-01-25 LAB — TROPONIN I (HIGH SENSITIVITY): Troponin I (High Sensitivity): 2 ng/L (ref <18)

## 2022-01-25 LAB — LIPASE, BLOOD: Lipase: 40 U/L (ref 11–51)

## 2022-01-25 MED ORDER — ONDANSETRON HCL 4 MG/2ML IJ SOLN: 4.0000 mg | Freq: Four times a day (QID) | INTRAMUSCULAR | Status: DC | PRN

## 2022-01-25 MED ORDER — MAGIC MOUTHWASH: 15.0000 mL | Freq: Four times a day (QID) | ORAL | Status: DC | PRN

## 2022-01-25 MED ORDER — METRONIDAZOLE 500 MG/100ML IV SOLN
500.0000 mg | Freq: Once | INTRAVENOUS | Status: AC
  Administered 2022-01-25: 500 mg via INTRAVENOUS
  Filled 2022-01-25: qty 100

## 2022-01-25 MED ORDER — ACETAMINOPHEN 325 MG PO TABS: 325.0000 mg | ORAL_TABLET | Freq: Four times a day (QID) | ORAL | Status: DC | PRN

## 2022-01-25 MED ORDER — BISACODYL 10 MG RE SUPP: 10.0000 mg | Freq: Two times a day (BID) | RECTAL | Status: DC | PRN

## 2022-01-25 MED ORDER — SODIUM CHLORIDE 0.9 % IV SOLN: 8.0000 mg | Freq: Four times a day (QID) | INTRAVENOUS | Status: DC | PRN

## 2022-01-25 MED ORDER — ONDANSETRON 4 MG PO TBDP: 4.0000 mg | ORAL_TABLET | Freq: Four times a day (QID) | ORAL | Status: DC | PRN

## 2022-01-25 MED ORDER — ONDANSETRON 8 MG PO TBDP
8.0000 mg | ORAL_TABLET | Freq: Once | ORAL | Status: AC
  Administered 2022-01-25: 8 mg via ORAL
  Filled 2022-01-25: qty 1

## 2022-01-25 MED ORDER — ALUM & MAG HYDROXIDE-SIMETH 200-200-20 MG/5ML PO SUSP
30.0000 mL | Freq: Once | ORAL | Status: AC
  Administered 2022-01-25: 30 mL via ORAL
  Filled 2022-01-25: qty 30

## 2022-01-25 MED ORDER — ENSURE PRE-SURGERY PO LIQD
296.0000 mL | Freq: Once | ORAL | Status: AC
  Administered 2022-01-26: 296 mL via ORAL
  Filled 2022-01-25 (×2): qty 296

## 2022-01-25 MED ORDER — SODIUM CHLORIDE 0.9 % IV BOLUS
1000.0000 mL | Freq: Once | INTRAVENOUS | Status: AC
  Administered 2022-01-25: 1000 mL via INTRAVENOUS

## 2022-01-25 MED ORDER — ALUM & MAG HYDROXIDE-SIMETH 200-200-20 MG/5ML PO SUSP: 30.0000 mL | Freq: Four times a day (QID) | ORAL | Status: DC | PRN

## 2022-01-25 MED ORDER — PROCHLORPERAZINE EDISYLATE 10 MG/2ML IJ SOLN
5.0000 mg | INTRAMUSCULAR | Status: DC | PRN
  Filled 2022-01-25: qty 2

## 2022-01-25 MED ORDER — METOPROLOL TARTRATE 5 MG/5ML IV SOLN: 5.0000 mg | Freq: Four times a day (QID) | INTRAVENOUS | Status: DC | PRN

## 2022-01-25 MED ORDER — DIPHENHYDRAMINE HCL 12.5 MG/5ML PO ELIX: 12.5000 mg | ORAL_SOLUTION | Freq: Four times a day (QID) | ORAL | Status: DC | PRN

## 2022-01-25 MED ORDER — MENTHOL 3 MG MT LOZG: 1.0000 | LOZENGE | OROMUCOSAL | Status: DC | PRN

## 2022-01-25 MED ORDER — ACETAMINOPHEN 325 MG PO TABS: 650.0000 mg | ORAL_TABLET | Freq: Four times a day (QID) | ORAL | Status: DC | PRN

## 2022-01-25 MED ORDER — FENTANYL CITRATE PF 50 MCG/ML IJ SOSY
50.0000 ug | PREFILLED_SYRINGE | Freq: Once | INTRAMUSCULAR | Status: AC
  Administered 2022-01-25: 50 ug via INTRAVENOUS
  Filled 2022-01-25: qty 1

## 2022-01-25 MED ORDER — MIRABEGRON ER 25 MG PO TB24
50.0000 mg | ORAL_TABLET | Freq: Every day | ORAL | Status: DC
  Administered 2022-01-25: 50 mg via ORAL
  Filled 2022-01-25: qty 2

## 2022-01-25 MED ORDER — SIMETHICONE 80 MG PO CHEW: 40.0000 mg | CHEWABLE_TABLET | Freq: Four times a day (QID) | ORAL | Status: DC | PRN

## 2022-01-25 MED ORDER — METOCLOPRAMIDE HCL 5 MG/ML IJ SOLN: 5.0000 mg | Freq: Three times a day (TID) | INTRAMUSCULAR | Status: DC | PRN

## 2022-01-25 MED ORDER — IOHEXOL 300 MG/ML  SOLN
100.0000 mL | Freq: Once | INTRAMUSCULAR | Status: AC | PRN
  Administered 2022-01-25: 100 mL via INTRAVENOUS

## 2022-01-25 MED ORDER — LACTATED RINGERS IV SOLN: INTRAVENOUS | Status: DC

## 2022-01-25 MED ORDER — CHLORHEXIDINE GLUCONATE CLOTH 2 % EX PADS
6.0000 | MEDICATED_PAD | Freq: Once | CUTANEOUS | Status: AC
  Administered 2022-01-25: 6 via TOPICAL

## 2022-01-25 MED ORDER — PROCHLORPERAZINE MALEATE 10 MG PO TABS: 10.0000 mg | ORAL_TABLET | Freq: Four times a day (QID) | ORAL | Status: DC | PRN

## 2022-01-25 MED ORDER — LACTATED RINGERS IV BOLUS: 1000.0000 mL | Freq: Three times a day (TID) | INTRAVENOUS | Status: DC | PRN

## 2022-01-25 MED ORDER — PHENOL 1.4 % MT LIQD: 2.0000 | OROMUCOSAL | Status: DC | PRN

## 2022-01-25 MED ORDER — DIPHENHYDRAMINE HCL 50 MG/ML IJ SOLN: 12.5000 mg | Freq: Four times a day (QID) | INTRAMUSCULAR | Status: DC | PRN

## 2022-01-25 MED ORDER — ONDANSETRON HCL 4 MG/2ML IJ SOLN
4.0000 mg | Freq: Once | INTRAMUSCULAR | Status: AC
  Administered 2022-01-25: 4 mg via INTRAVENOUS
  Filled 2022-01-25: qty 2

## 2022-01-25 MED ORDER — LIDOCAINE VISCOUS HCL 2 % MT SOLN
15.0000 mL | Freq: Once | OROMUCOSAL | Status: DC
  Filled 2022-01-25: qty 15

## 2022-01-25 MED ORDER — LACTATED RINGERS IV BOLUS
1000.0000 mL | Freq: Once | INTRAVENOUS | Status: AC
  Administered 2022-01-25: 1000 mL via INTRAVENOUS

## 2022-01-25 MED ORDER — PROCHLORPERAZINE EDISYLATE 10 MG/2ML IJ SOLN
5.0000 mg | Freq: Four times a day (QID) | INTRAMUSCULAR | Status: DC | PRN
  Administered 2022-01-26: 10 mg via INTRAVENOUS

## 2022-01-25 MED ORDER — ENOXAPARIN SODIUM 40 MG/0.4ML IJ SOSY: 40.0000 mg | PREFILLED_SYRINGE | INTRAMUSCULAR | Status: DC

## 2022-01-25 MED ORDER — BUPIVACAINE LIPOSOME 1.3 % IJ SUSP: 20.0000 mL | Freq: Once | INTRAMUSCULAR | Status: DC

## 2022-01-25 MED ORDER — SODIUM CHLORIDE 0.9 % IV SOLN
1.0000 g | Freq: Once | INTRAVENOUS | Status: AC
  Administered 2022-01-25: 1 g via INTRAVENOUS
  Filled 2022-01-25: qty 10

## 2022-01-25 MED ORDER — HYDROMORPHONE HCL 1 MG/ML IJ SOLN
1.0000 mg | Freq: Once | INTRAMUSCULAR | Status: AC
  Administered 2022-01-25: 1 mg via INTRAVENOUS
  Filled 2022-01-25: qty 1

## 2022-01-25 MED ORDER — ACETAMINOPHEN 650 MG RE SUPP: 650.0000 mg | Freq: Four times a day (QID) | RECTAL | Status: DC | PRN

## 2022-01-25 MED ORDER — ONDANSETRON HCL 4 MG/2ML IJ SOLN
4.0000 mg | Freq: Four times a day (QID) | INTRAMUSCULAR | Status: DC | PRN
  Administered 2022-01-25: 4 mg via INTRAVENOUS
  Filled 2022-01-25 (×2): qty 2

## 2022-01-25 MED ORDER — ACETAMINOPHEN 500 MG PO TABS
1000.0000 mg | ORAL_TABLET | ORAL | Status: AC
  Administered 2022-01-26: 1000 mg via ORAL
  Filled 2022-01-25: qty 2

## 2022-01-25 MED ORDER — METHOCARBAMOL 1000 MG/10ML IJ SOLN: 1000.0000 mg | Freq: Four times a day (QID) | INTRAVENOUS | Status: DC | PRN

## 2022-01-25 MED ORDER — LIP MEDEX EX OINT
TOPICAL_OINTMENT | Freq: Two times a day (BID) | CUTANEOUS | Status: DC
  Administered 2022-01-25: 75 via TOPICAL
  Filled 2022-01-25: qty 7

## 2022-01-25 MED ORDER — SODIUM CHLORIDE 0.9 % IV SOLN
2.0000 g | INTRAVENOUS | Status: DC
  Administered 2022-01-25: 2 g via INTRAVENOUS
  Filled 2022-01-25: qty 20

## 2022-01-25 MED ORDER — HYDROMORPHONE HCL 1 MG/ML IJ SOLN
0.5000 mg | INTRAMUSCULAR | Status: DC | PRN
  Administered 2022-01-26: 1 mg via INTRAVENOUS
  Filled 2022-01-25 (×2): qty 1

## 2022-01-25 MED ORDER — GABAPENTIN 300 MG PO CAPS
300.0000 mg | ORAL_CAPSULE | ORAL | Status: DC
  Filled 2022-01-25: qty 1

## 2022-01-25 MED ORDER — BUPIVACAINE 0.25 % ON-Q PUMP DUAL CATH 300 ML: 300.0000 mL | INJECTION | Status: DC

## 2022-01-25 MED ORDER — HYDROMORPHONE HCL 1 MG/ML IJ SOLN: 0.5000 mg | INTRAMUSCULAR | Status: DC | PRN

## 2022-01-25 MED ORDER — CHLORHEXIDINE GLUCONATE CLOTH 2 % EX PADS: 6.0000 | MEDICATED_PAD | Freq: Once | CUTANEOUS | Status: DC

## 2022-01-25 NOTE — Consult Note (Signed)
Jo Lee  02-11-1965 [id number]  CARE TEAM:  PCP: Adin Hector, MD  Outpatient Care Team: Patient Care Team: Adin Hector, MD as PCP - General (Internal Medicine)  Inpatient Treatment Team: Treatment Team: Attending Provider: Nolon Nations, MD; Physician Assistant: Sherrill Raring, PA-C; Consulting Physician: Nolon Nations, MD; Student Nurse: Val Riles; Paramedic: Lou Miner, Paramedic   This patient is a 57 y.o.female who presents today for surgical evaluation at the request of Dr Matilde Sprang.   Chief complaint / Reason for evaluation: Gallstones w abdominal pain  57 year old woman otherwise healthy who had noted worsening abdominal pain with nausea yesterday after lunch.  Had some [place] food and margaritas with friends.  Felt nauseated and worse.  Tried Pepto-Bismol and other medications without much relief.  Went over his red alcohol but persistent.  Worsening hangover.  Cannot lie down flat.  Denies any sick contacts.  History of endometriosis.  No history of bowel obstructions or inflammatory bowel disease.  Normally moves her bowels about every other day.  Otherwise rather physically active.  Based on work symptoms she came to [facility] emergency department.  Differential diagnosis not very conclusive but pain in epigastric and right upper quadrant region reach concerned of cholecystitis.  CT scan suspicious for as well.  Persistent symptoms not relieved.  Surgical consultation requested.  She is here today with her husband.  Normally can walk 1/2-hour without difficulty.  No history of cardiac or pulmonary issues.  Does not feel comfortable lying down.  Discomfort worse.  No bad heartburn or reflux.  No improvement in symptoms with antiacid medication.  History of gastritis or heavy NSAID use.  No history hepatitis nor pancreatitis.   Assessment  Jo Lee  57 y.o. female       Problem List:  Principal Problem:   Acute calculous cholecystitis Active  Problems:   Depression   Arthritis   Endometriosis   Postprandial nausea vomiting persistent pain right upper quadrant with Percell Miller sign and CT scan concerning for cholecystitis  Plan:  Admit  IV fluids.  Nausea and pain control.  IV antibiotics.  An otherwise healthy patient do ceftriaxone.  Probable cholecystectomy tomorrow. The anatomy & physiology of hepatobiliary & pancreatic function was discussed.  The pathophysiology of gallbladder dysfunction was discussed.  Natural history risks without surgery was discussed.   I feel the risks of no intervention will lead to serious problems that outweigh the operative risks; therefore, I recommended cholecystectomy to remove the pathology.  I explained laparoscopic techniques with possible need for an open approach.  Probable cholangiogram to evaluate the bilary tract was explained as well.    Risks such as bleeding, infection, diarrhea and other bowel changes, abscess, leak, injury to other organs, need for repair of tissues / organs, need for further treatment, stroke, heart attack, death, and other risks were discussed.  I noted a good likelihood this will help address the problem, but there is a chance it may not help.  Possibility that this will not correct all abdominal symptoms was explained.  Goals of post-operative recovery were discussed as well.  We will work to minimize complications.  An educational handout further explaining the pathology and treatment options was given as well.  Questions were answered.  The patient expresses understanding & wishes to proceed with surgery.  -History of pain attacks.  Question depression.  Seems stable for now.  Hold off on need for benzodiazepines. -VTE prophylaxis- SCDs, etc -  mobilize as tolerated to help recovery  I reviewed nursing notes, ED provider notes, last 24 h vitals and pain scores, last 48 h intake and output, last 24 h labs and trends, and last 24 h imaging results. I have reviewed  this patient's available data, including medical history, events of note, test results, etc as part of my evaluation.  A significant portion of that time was spent in counseling.  Care during the described time interval was provided by me.  This care required moderate level of medical decision making.  01/25/2022  Ardeth Sportsman, MD, FACS, MASCRS Esophageal, Gastrointestinal & Colorectal Surgery Robotic and Minimally Invasive Surgery  [facility] [address] [place], [place] [address] [phone] Fax [phone] Main  CONTACT INFORMATION:  Weekday (9AM-5PM): Call [facility] main office at [phone]  Weeknight (5PM-9AM) or Weekend/Holiday: Check [email or web address] (password " [email or web address]") for General Surgery [facility] coverage  (Please, do not use SecureChat as it is not reliable communication to reach operating surgeons for immediate patient care given surgeries/outpatient duties/clinic/cross-coverage/off post-call which would lead to a delay in care.  Epic staff messaging available for outptient concerns, but may not be answered for 48 hours or more).     01/25/2022      Past Medical History:  Diagnosis Date   Anemia    Arthritis    Depression    Endometriosis    Panic attack    Recurrent sinusitis     Past Surgical History:  Procedure Laterality Date   COLONOSCOPY WITH PROPOFOL N/A 11/14/2018   Procedure: COLONOSCOPY WITH PROPOFOL;  Surgeon: Toledo, Boykin Nearing, MD;  Location: [facility] ENDOSCOPY;  Service: Gastroenterology;  Laterality: N/A;   TUBAL LIGATION      Social History   Socioeconomic History   Marital status: Married    Spouse name: Not on file   Number of children: Not on file   Years of education: Not on file   Highest education level: Not on file  Occupational History   Not on file  Tobacco Use   Smoking status: Never   Smokeless tobacco: Never  Substance and Sexual Activity   Alcohol use: Not on  file   Drug use: Not on file   Sexual activity: Not on file  Other Topics Concern   Not on file  Social History Narrative   Not on file   Social Determinants of Health   Financial Resource Strain: Not on file  Food Insecurity: Not on file  Transportation Needs: Not on file  Physical Activity: Not on file  Stress: Not on file  Social Connections: Not on file  Intimate Partner Violence: Not on file    No family history on file.  Current Facility-Administered Medications  Medication Dose Route Frequency Provider Last Rate Last Admin   acetaminophen (TYLENOL) tablet 650 mg  650 mg Oral Q6H PRN Karie Soda, MD       Or   acetaminophen (TYLENOL) suppository 650 mg  650 mg Rectal Q6H PRN Karie Soda, MD       [START ON 01/26/2022] acetaminophen (TYLENOL) tablet 1,000 mg  1,000 mg Oral On Call to OR Karie Soda, MD       alum & mag hydroxide-simeth (MAALOX/MYLANTA) 200-200-20 MG/5ML suspension 30 mL  30 mL Oral Q6H PRN Karie Soda, MD       bisacodyl (DULCOLAX) suppository 10 mg  10 mg Rectal Q12H PRN Karie Soda, MD       bupivacaine  liposome (EXPAREL) 1.3 % injection 266 mg  20 mL Infiltration Once Michael Boston, MD       cefTRIAXone (ROCEPHIN) 2 g in sodium chloride 0.9 % 100 mL IVPB  2 g Intravenous Q24H Michael Boston, MD       Chlorhexidine Gluconate Cloth 2 % PADS 6 each  6 each Topical Once Michael Boston, MD       And   Chlorhexidine Gluconate Cloth 2 % PADS 6 each  6 each Topical Once Michael Boston, MD       diphenhydrAMINE (BENADRYL) 12.5 MG/5ML elixir 12.5 mg  12.5 mg Oral Q6H PRN Michael Boston, MD       Or   diphenhydrAMINE (BENADRYL) injection 12.5 mg  12.5 mg Intravenous Q6H PRN Michael Boston, MD       Derrill Memo ON 01/27/2022] enoxaparin (LOVENOX) injection 40 mg  40 mg Subcutaneous Q24H Michael Boston, MD       [START ON 01/26/2022] feeding supplement (ENSURE PRE-SURGERY) liquid 296 mL  296 mL Oral Once Michael Boston, MD       Derrill Memo ON 01/26/2022] gabapentin  (NEURONTIN) capsule 300 mg  300 mg Oral On Call to OR Michael Boston, MD       HYDROmorphone (DILAUDID) injection 0.5-2 mg  0.5-2 mg Intravenous Q2H PRN Michael Boston, MD       lactated ringers bolus 1,000 mL  1,000 mL Intravenous Q8H PRN Michael Boston, MD       lactated ringers bolus 1,000 mL  1,000 mL Intravenous Once Michael Boston, MD       lactated ringers infusion   Intravenous Continuous Michael Boston, MD       lidocaine (XYLOCAINE) 2 % viscous mouth solution 15 mL  15 mL Oral Once Sage, Haley, PA-C       lip balm (CARMEX) ointment   Topical BID Michael Boston, MD       magic mouthwash  15 mL Oral QID PRN Michael Boston, MD       menthol-cetylpyridinium (CEPACOL) lozenge 3 mg  1 lozenge Oral PRN Michael Boston, MD       methocarbamol (ROBAXIN) 1,000 mg in dextrose 5 % 100 mL IVPB  1,000 mg Intravenous Q6H PRN Michael Boston, MD       metoCLOPramide (REGLAN) injection 5-10 mg  5-10 mg Intravenous Q8H PRN Michael Boston, MD       metoprolol tartrate (LOPRESSOR) injection 5 mg  5 mg Intravenous Q6H PRN Michael Boston, MD       mirabegron ER (MYRBETRIQ) tablet 50 mg  50 mg Oral Daily Michael Boston, MD       ondansetron [facility]) injection 4 mg  4 mg Intravenous Q6H PRN Michael Boston, MD       Or   ondansetron (ZOFRAN) 8 mg in sodium chloride 0.9 % 50 mL IVPB  8 mg Intravenous Q6H PRN Michael Boston, MD       ondansetron (ZOFRAN-ODT) disintegrating tablet 4 mg  4 mg Oral Q6H PRN Michael Boston, MD       Or   ondansetron [facility]) injection 4 mg  4 mg Intravenous Q6H PRN Michael Boston, MD       phenol (CHLORASEPTIC) mouth spray 2 spray  2 spray Mouth/Throat PRN Michael Boston, MD       prochlorperazine (COMPAZINE) injection 5-10 mg  5-10 mg Intravenous Q4H PRN Michael Boston, MD       prochlorperazine (COMPAZINE) tablet 10 mg  10 mg Oral Q6H PRN Michael Boston, MD  Or   prochlorperazine (COMPAZINE) injection 5-10 mg  5-10 mg Intravenous Q6H PRN Michael Boston, MD       simethicone [facility]) chewable  tablet 40 mg  40 mg Oral Q6H PRN Michael Boston, MD       Current Outpatient Medications  Medication Sig Dispense Refill   mirabegron ER (MYRBETRIQ) 50 MG TB24 tablet Take 1 tablet (50 mg total) by mouth daily. 30 tablet 11   nitrofurantoin (MACRODANTIN) 100 MG capsule Take 1 capsule (100 mg total) by mouth daily. 30 capsule 11     Allergies  Allergen Reactions   Codeine Other (See Comments)    dizziness   Sulfa Antibiotics Hives    ROS:   All other systems reviewed & are negative except per HPI or as noted below: Constitutional:  No fevers, chills, sweats.  Weight stable Eyes:  No vision changes, No discharge HENT:  No sore throats, nasal drainage Lymph: No neck swelling, No bruising easily Pulmonary:  No cough, productive sputum CV: No orthopnea, PND  Patient walks 30 minutes without difficulty.  No exertional chest/neck/shoulder/arm pain.  GI:  No personal nor family history of GI/colon cancer, inflammatory bowel disease, irritable bowel syndrome, allergy such as Celiac Sprue, dietary/dairy problems, colitis, ulcers nor gastritis.  No recent sick contacts/gastroenteritis.  No travel outside the country.  No changes in diet.  Renal: No UTIs, No hematuria Genital:  No drainage, bleeding, masses Musculoskeletal: No severe joint pain.  Good ROM major joints Skin:  No sores or lesions Heme/Lymph:  No easy bleeding.  No swollen lymph nodes   BP (!) 146/86 (BP Location: Right Arm)   Pulse 77   Temp 98 F (36.7 C) (Oral)   Resp 18   Ht 5\' 9"  (1.753 m)   Wt 83.9 kg   SpO2 100%   BMI 27.32 kg/m   Physical Exam:  Constitutional: Not cachectic.  Hygeine adequate.  Vitals signs as above.  Not toxic but obviously exhausted and uncomfortable.  Mild distress. Eyes: Pupils reactive, normal extraocular movements. Sclera nonicteric Neuro: CN II-XII intact.  No major focal sensory defects.  No major motor deficits. Lymph: No head/neck/groin lymphadenopathy Psych:  No severe agitation.   No severe anxiety.  Judgment & insight Adequate, Oriented x4, HENT: Normocephalic, Mucus membranes moist.  No thrush.   Neck: Supple, No tracheal deviation.  No obvious thyromegaly Chest: No pain to chest wall compression.  Good respiratory excursion.  No audible wheezing CV:  Pulses intact.  regular rhythm.  No major extremity edema  Abdomen:  Flat Hernia: Not present. Diastasis recti: Not present. Somewhat firm.   Mildly distended.  Tenderness at right upper quadrant and epigastric region.  Left upper quadrant and lower abdomen nontender.  Positive Murphy sign. .  No hepatomegaly.  No splenomegaly  Gen:  Inguinal hernia: Not present.  Inguinal lymph nodes: without lymphadenopathy.   Rectal: (Deferred) Ext: No obvious deformity or contracture.  Edema: Not present.  No cyanosis Skin: No major subcutaneous nodules.  Warm and dry Musculoskeletal: Severe joint rigidity not present.  No obvious clubbing.  No digital petechiae.     Results:   Labs: Results for orders placed or performed during the hospital encounter of 01/25/22 (from the past 48 hour(s))  Urinalysis, Routine w reflex microscopic Urine, Clean Catch     Status: Abnormal   Collection Time: 01/25/22  8:15 AM  Result Value Ref Range   Color, Urine YELLOW YELLOW   APPearance CLOUDY (A) CLEAR   Specific Gravity,  Urine 1.018 1.005 - 1.030   pH 8.0 5.0 - 8.0   Glucose, UA NEGATIVE NEGATIVE mg/dL   Hgb urine dipstick NEGATIVE NEGATIVE   Bilirubin Urine NEGATIVE NEGATIVE   Ketones, ur NEGATIVE NEGATIVE mg/dL   Protein, ur 30 (A) NEGATIVE mg/dL   Nitrite NEGATIVE NEGATIVE   Leukocytes,Ua NEGATIVE NEGATIVE   RBC / HPF 0-5 0 - 5 RBC/hpf   WBC, UA 6-10 0 - 5 WBC/hpf   Bacteria, UA NONE SEEN NONE SEEN   Squamous Epithelial / HPF 0-5 0 - 5 /HPF   Mucus PRESENT     Comment: Performed at [facility], [place] [address] [place], [place] [address]  Lipase, blood     Status: None   Collection Time: 01/25/22 10:02 AM   Result Value Ref Range   Lipase 40 11 - 51 U/L    Comment: Performed at [facility], [place] [address], [place], [place] [address]  Comprehensive metabolic panel     Status: Abnormal   Collection Time: 01/25/22 10:02 AM  Result Value Ref Range   Sodium 137 135 - 145 mmol/L   Potassium 4.3 3.5 - 5.1 mmol/L   Chloride 99 98 - 111 mmol/L   CO2 27 22 - 32 mmol/L   Glucose, Bld 131 (H) 70 - 99 mg/dL    Comment: Glucose reference range applies only to samples taken after fasting for at least 8 hours.   BUN 13 6 - 20 mg/dL   Creatinine, Ser 0.72 0.44 - 1.00 mg/dL   Calcium 9.3 8.9 - 10.3 mg/dL   Total Protein 6.9 6.5 - 8.1 g/dL   Albumin 4.3 3.5 - 5.0 g/dL   AST 27 15 - 41 U/L   ALT 40 0 - 44 U/L   Alkaline Phosphatase 72 38 - 126 U/L   Total Bilirubin 0.6 0.3 - 1.2 mg/dL   GFR, Estimated >60 >60 mL/min    Comment: (NOTE) Calculated using the CKD-EPI Creatinine Equation (2021)    Anion gap 11 5 - 15    Comment: Performed at [facility], [place] [address] [place], [place] [address]  CBC     Status: None   Collection Time: 01/25/22 10:02 AM  Result Value Ref Range   WBC 9.1 4.0 - 10.5 K/uL   RBC 4.58 3.87 - 5.11 MIL/uL   Hemoglobin 13.5 12.0 - 15.0 g/dL   HCT 40.3 36.0 - 46.0 %   MCV 88.0 80.0 - 100.0 fL   MCH 29.5 26.0 - 34.0 pg   MCHC 33.5 30.0 - 36.0 g/dL   RDW 12.4 11.5 - 15.5 %   Platelets 173 150 - 400 K/uL   nRBC 0.0 0.0 - 0.2 %    Comment: Performed at [facility], [place] [address], [place], [place] [address]  Troponin I (High Sensitivity)     Status: None   Collection Time: 01/25/22 10:02 AM  Result Value Ref Range   Troponin I (High Sensitivity) 2 <18 ng/L    Comment: (NOTE) Elevated high sensitivity troponin I (hsTnI) values and significant  changes across serial measurements may suggest ACS but many other  chronic and acute conditions are known to elevate hsTnI results.  Refer to the "Links" section for chest  pain algorithms and additional  guidance. Performed at [facility], [place] [address] [place], [place] [address]     Imaging / Studies: US Abdomen Limited RUQ (LIVER/GB)  Result Date: 01/25/2022 CLINICAL DATA:  Right upper quadrant pain EXAM: ULTRASOUND ABDOMEN LIMITED RIGHT UPPER QUADRANT COMPARISON:  CT abdomen pelvis 01/25/2022 FINDINGS: Gallbladder: There is a 2.7 cm stone at the gallbladder neck. And additional 1.8 cm per mobile stone. Borderline gallbladder wall thickening. Negative sonographic Murphy sign. No significant pericholecystic fluid. Common bile duct: Diameter: 0.4 cm, within normal limits Liver: No focal lesion identified. Within normal limits in parenchymal echogenicity. Portal vein is patent on color Doppler imaging with normal direction of blood flow towards the liver. Other: None. IMPRESSION: Cholelithiasis with borderline gallbladder wall thickening, negative sonographic Murphy sign and no significant pericholecystic fluid. Findings are indeterminate for acute cholecystitis. Electronically Signed   By: Audie Pinto M.D.   On: 01/25/2022 13:12   CT Abdomen Pelvis W Contrast  Result Date: 01/25/2022 CLINICAL DATA:  Pancreatitis, acute, severe. Epigastric abdominal pain. EXAM: CT ABDOMEN AND PELVIS WITH CONTRAST TECHNIQUE: Multidetector CT imaging of the abdomen and pelvis was performed using the standard protocol following bolus administration of intravenous contrast. RADIATION DOSE REDUCTION: This exam was performed according to the departmental dose-optimization program which includes automated exposure control, adjustment of the mA and/or kV according to patient size and/or use of iterative reconstruction technique. CONTRAST:  122mL OMNIPAQUE IOHEXOL 300 MG/ML  SOLN COMPARISON:  One-view abdomen 07/21/2019 FINDINGS: Lower chest: Mild dependent atelectasis is present bilaterally. The lungs are otherwise clear. The heart size is normal. No significant  pleural or pericardial effusion is present. Hepatobiliary: Mild periportal edema present. The common bile duct is within normal limits size. Inflammatory changes are present about the gallbladder neck. A cholesterol stone is present in the fundus of the gallbladder measuring up to 3.3 cm. Free fluid surrounds the neck of the gallbladder. Pancreas: Unremarkable. No pancreatic ductal dilatation or surrounding inflammatory changes. Spleen: Normal in size without focal abnormality. Adrenals/Urinary Tract: Adrenal glands are normal bilaterally. Kidneys are unremarkable. No stone or mass lesion is present. No obstruction is present. The ureters are within normal limits bilaterally. The urinary bladder is normal. Stomach/Bowel: The stomach and duodenum are within normal limits. The small bowel is unremarkable. Terminal ileum is within normal limits. The appendix is visualized and normal. The ascending and transverse colon are within normal limits. The descending and sigmoid colon are normal. Vascular/Lymphatic: No significant vascular findings are present. No enlarged abdominal or pelvic lymph nodes. Reproductive: Uterus and bilateral adnexa are unremarkable. Other: No abdominal wall hernia or abnormality. No abdominopelvic ascites. Musculoskeletal: Vertebral body heights and alignment are normal. Degenerative endplate changes are present at L4-5 asymmetric on the left. Bony pelvis is within normal limits. Hips are located and within normal limits bilaterally. IMPRESSION: 1. Inflammatory changes about the gallbladder neck with a 3.3 cm cholesterol stone in the fundus of the gallbladder. Findings are concerning acute cholecystitis. Right upper quadrant ultrasound may be useful for further evaluation if clinically indicated. 2. Normal CT appearance of the pancreas. 3. No other acute or focal lesion to explain the patient's epigastric pain. Electronically Signed   By: San Morelle M.D.   On: 01/25/2022 12:12   DG  Chest Portable 1 View  Result Date: 01/25/2022 CLINICAL DATA:  Chest pain EXAM: PORTABLE CHEST 1 VIEW COMPARISON:  04/12/2016 FINDINGS: The heart size and mediastinal contours are within normal limits. Both lungs are clear. The visualized skeletal structures are unremarkable. IMPRESSION: No active disease. Electronically Signed   By: Davina Poke D.O.   On: 01/25/2022 11:22    Medications / Allergies: per chart  Antibiotics: Anti-infectives (From admission, onward)  Start     Dose/Rate Route Frequency Ordered Stop   01/25/22 2200  cefTRIAXone (ROCEPHIN) 2 g in sodium chloride 0.9 % 100 mL IVPB        2 g 200 mL/hr over 30 Minutes Intravenous Every 24 hours 01/25/22 1536 02/01/22 2159   01/25/22 1230  cefTRIAXone (ROCEPHIN) 1 g in sodium chloride 0.9 % 100 mL IVPB        1 g 200 mL/hr over 30 Minutes Intravenous  Once 01/25/22 1223 01/25/22 1338   01/25/22 1230  metroNIDAZOLE (FLAGYL) IVPB 500 mg        500 mg 100 mL/hr over 60 Minutes Intravenous  Once 01/25/22 1223 01/25/22 1502         Note: Portions of this report may have been transcribed using voice recognition software. Every effort was made to ensure accuracy; however, inadvertent computerized transcription errors may be present.   Any transcriptional errors that result from this process are unintentional.    Adin Hector, MD, FACS, MASCRS Esophageal, Gastrointestinal & Colorectal Surgery Robotic and Minimally Invasive Surgery  [facility] [address], [place], [place] [address] [phone] Fax [phone] Main  CONTACT INFORMATION:  Weekday (9AM-5PM): Call [facility] main office at [phone]  Weeknight (5PM-9AM) or Weekend/Holiday: Check [email or web address] (password " [email or web address]") for General Surgery [facility] coverage  (Please, do not use SecureChat as it is not reliable communication to reach operating surgeons for immediate patient care given  surgeries/outpatient duties/clinic/cross-coverage/off post-call which would lead to a delay in care.  Epic staff messaging available for outptient concerns, but may not be answered for 48 hours or more).      01/25/2022  3:41 PM

## 2022-01-25 NOTE — ED Triage Notes (Addendum)
57 yo female c/o mid epigastric pain that radiates to her mid back. Pt reports pain began around 1 am this morning and is accompanied by nausea and vomiting. Pt reports 4 episodes of non bloody emesis.Pt states pain is better when sitting up but increases upon reclining.  Pt reports increase in thirst that is out of the ordinary. Pt denies diarrhea, constipation, or fever. Pt report mild intermittent mid sternal chest pain.

## 2022-01-25 NOTE — ED Provider Notes (Signed)
[facility] DEPT Provider Note   CSN: [id number] Arrival date & time: 01/25/22  0757     History  Chief Complaint  Patient presents with  . Abdominal Pain    Jo Lee is a 57 y.o. female.   Abdominal Pain    Patient presents to the emergency department due to epigastric pain and chest pain.  Patient states yesterday after her friend passed away suddenly she had copious amounts of alcohol, this morning she started having severe epigastric pain which radiates to her back and up to her chest.  It is constant but the severity waxes and wanes, is worse never she lays down flat.  This denies any postprandial relationship.  Associated with nausea and vomiting but no hematemesis.  History of tubal ligation, denies any other abdominal surgeries.    Home Medications Prior to Admission medications   Medication Sig Start Date End Date Taking? Authorizing Provider  mirabegron ER (MYRBETRIQ) 50 MG TB24 tablet Take 1 tablet (50 mg total) by mouth daily. 01/23/19   Bjorn Loser, MD  nitrofurantoin (MACRODANTIN) 100 MG capsule Take 1 capsule (100 mg total) by mouth daily. 07/31/19   Bjorn Loser, MD      Allergies    Codeine and Sulfa antibiotics    Review of Systems   Review of Systems  Gastrointestinal:  Positive for abdominal pain.    Physical Exam Updated Vital Signs BP (!) 169/85 (BP Location: Left Arm)   Pulse 65   Temp 97.7 F (36.5 C) (Oral)   Resp 16   Ht 5\' 9"  (1.753 m)   Wt 83.9 kg   SpO2 100%   BMI 27.32 kg/m  Physical Exam Vitals and nursing note reviewed. Exam conducted with a chaperone present.  Constitutional:      Appearance: Normal appearance.  HENT:     Head: Normocephalic and atraumatic.  Eyes:     General: No scleral icterus.       Right eye: No discharge.        Left eye: No discharge.     Extraocular Movements: Extraocular movements intact.     Pupils: Pupils are equal, round, and reactive to light.   Cardiovascular:     Rate and Rhythm: Normal rate and regular rhythm.     Pulses: Normal pulses.     Heart sounds: Normal heart sounds.     No friction rub. No gallop.     Comments: Upper and lower extremity pulses 2+ symmetric bilaterally Pulmonary:     Effort: Pulmonary effort is normal. No respiratory distress.     Breath sounds: Normal breath sounds.  Abdominal:     General: Abdomen is flat. Bowel sounds are normal. There is no distension.     Palpations: Abdomen is soft.     Tenderness: There is abdominal tenderness in the right upper quadrant and epigastric area. Positive signs include Murphy's sign.  Skin:    General: Skin is warm and dry.     Coloration: Skin is not jaundiced.  Neurological:     Mental Status: She is alert. Mental status is at baseline.     Coordination: Coordination normal.     ED Results / Procedures / Treatments   Labs (all labs ordered are listed, but only abnormal results are displayed) Labs Reviewed  COMPREHENSIVE METABOLIC PANEL - Abnormal; Notable for the following components:      Result Value   Glucose, Bld 131 (*)    All other components within normal limits  URINALYSIS, ROUTINE W REFLEX MICROSCOPIC - Abnormal; Notable for the following components:   APPearance CLOUDY (*)    Protein, ur 30 (*)    All other components within normal limits  LIPASE, BLOOD  CBC  TROPONIN I (HIGH SENSITIVITY)    EKG None  Radiology No results found.  Procedures Procedures    Medications Ordered in ED Medications  sodium chloride 0.9 % bolus 1,000 mL (has no administration in time range)  ondansetron (ZOFRAN) injection 4 mg (has no administration in time range)  alum & mag hydroxide-simeth (MAALOX/MYLANTA) 200-200-20 MG/5ML suspension 30 mL (has no administration in time range)    And  lidocaine (XYLOCAINE) 2 % viscous mouth solution 15 mL (has no administration in time range)    ED Course/ Medical Decision Making/ A&P Clinical Course as of  01/25/22 1526  Sun Jan 25, 2022  1223 I spoke with Dr. Michaell Cowing of general surgery who agreed to take the patient to the OR.  Will start empirically on Flagyl and Rocephin. [HS]    Clinical Course User Index [HS] Theron Arista, PA-C                             Medical Decision Making Amount and/or Complexity of Data Reviewed Labs: ordered. Radiology: ordered.  Risk OTC drugs. Prescription drug management.   Patient presents to the emergency department due to upper abdominal pain chest pain.  [place] includes but limited to ACS, dissection, AAA, gastritis, perforated gastric ulcer, pancreatitis, cholecystitis, dehydration, AKI, metabolic abnormality.  I viewed external medical records, history of tube ligation.  Patient's husband is at bedside providing adequate history.  On exam patient has very focal epigastric tenderness, there is some mild right upper quadrant tenderness and + Murphy sign.  No CVA tenderness.  Abdomen is nonperitoneal but she is very uncomfortable.  She does have symmetric upper and lower extremity pulses I do not think this is consistent with a dissection.   -BP (!) 169/85 (BP Location: Left Arm)   Pulse 65   Temp 97.7 F (36.5 C) (Oral)   Resp 16   Ht 5\' 9"  (1.753 m)   Wt 83.9 kg   SpO2 100%   BMI 27.32 kg/m   Will start with symptomatic management with fluids, Zofran and get laboratory workup as well as CT abdomen and pelvis and chest x-ray.  I ordered, viewed and interpreted laboratory workup. CBC without cytosis or anemia. CMP without gross electrolyte derangement, AKI or transaminitis. UA is unremarkable. Lipase is within normal limits Troponin low and baseline undetectable at 2  Given no EKG changes and low troponin do not think this is ACS.  Heart score is also very low at 2.  Do not think we need a second troponin.  CT is concerning for acute cholecystitis.  Repeat abdominal exam shows right upper quadrant tenderness again with a Murphy sign.  I  will order right upper quadrant ultrasound.  I consulted with general surgery and spoke with Dr. as documented ED course, will likely be taking the patient to the OR later today.  Will start on Flagyl and Rocephin.  Patient is updated on the plan.         Final Clinical Impression(s) / ED Diagnoses Final diagnoses:  None    Rx / DC Orders ED Discharge Orders     None         Acquanetta Belling, Theron Arista 01/25/22 1526    Kommor,  Debe Coder, MD 01/26/22 [id number]

## 2022-01-25 NOTE — ED Notes (Signed)
Patient has a urine culture in the main lab 

## 2022-01-26 ENCOUNTER — Inpatient Hospital Stay (HOSPITAL_COMMUNITY): Payer: BC Managed Care – PPO | Admitting: Certified Registered Nurse Anesthetist

## 2022-01-26 ENCOUNTER — Encounter (HOSPITAL_COMMUNITY): Admission: EM | Disposition: A | Discharge status: Home / Self Care | Payer: Self-pay | Source: Home / Self Care

## 2022-01-26 ENCOUNTER — Encounter (HOSPITAL_COMMUNITY): Payer: Self-pay

## 2022-01-26 ENCOUNTER — Other Ambulatory Visit: Payer: Self-pay

## 2022-01-26 HISTORY — PX: CHOLECYSTECTOMY: SHX55

## 2022-01-26 LAB — LIPASE, BLOOD: Lipase: 33 U/L (ref 11–51)

## 2022-01-26 LAB — COMPREHENSIVE METABOLIC PANEL
ALT: 199 U/L — ABNORMAL HIGH (ref 0–44)
AST: 198 U/L — ABNORMAL HIGH (ref 15–41)
Albumin: 3.8 g/dL (ref 3.5–5.0)
Alkaline Phosphatase: 75 U/L (ref 38–126)
Anion gap: 6 (ref 5–15)
BUN: 9 mg/dL (ref 6–20)
CO2: 27 mmol/L (ref 22–32)
Calcium: 8.6 mg/dL — ABNORMAL LOW (ref 8.9–10.3)
Chloride: 105 mmol/L (ref 98–111)
Creatinine, Ser: 0.73 mg/dL (ref 0.44–1.00)
GFR, Estimated: >60 mL/min (ref >60)
Glucose, Bld: 175 mg/dL — ABNORMAL HIGH (ref 70–99)
Potassium: 3.9 mmol/L (ref 3.5–5.1)
Sodium: 138 mmol/L (ref 135–145)
Total Bilirubin: 0.7 mg/dL (ref 0.3–1.2)
Total Protein: 6.1 g/dL — ABNORMAL LOW (ref 6.5–8.1)

## 2022-01-26 LAB — CBC
HCT: 38 % (ref 36.0–46.0)
Hemoglobin: 12.4 g/dL (ref 12.0–15.0)
MCH: 29.4 pg (ref 26.0–34.0)
MCHC: 32.6 g/dL (ref 30.0–36.0)
MCV: 90 fL (ref 80.0–100.0)
Platelets: 118 10*3/uL — ABNORMAL LOW (ref 150–400)
RBC: 4.22 MIL/uL (ref 3.87–5.11)
RDW: 12.7 % (ref 11.5–15.5)
WBC: 7.5 10*3/uL (ref 4.0–10.5)
nRBC: 0 % (ref 0.0–0.2)

## 2022-01-26 LAB — SURGICAL PCR SCREEN
MRSA, PCR: NEGATIVE
Staphylococcus aureus: NEGATIVE

## 2022-01-26 LAB — HIV ANTIBODY (ROUTINE TESTING W REFLEX): HIV Screen 4th Generation wRfx: NONREACTIVE

## 2022-01-26 SURGERY — LAPAROSCOPIC CHOLECYSTECTOMY WITH INTRAOPERATIVE CHOLANGIOGRAM
Anesthesia: General

## 2022-01-26 MED ORDER — FENTANYL CITRATE (PF) 250 MCG/5ML IJ SOLN
INTRAMUSCULAR | Status: AC
  Filled 2022-01-26: qty 5

## 2022-01-26 MED ORDER — ONDANSETRON 4 MG PO TBDP: 4.0000 mg | ORAL_TABLET | Freq: Four times a day (QID) | ORAL | Status: DC | PRN

## 2022-01-26 MED ORDER — MIDAZOLAM HCL 2 MG/2ML IJ SOLN
INTRAMUSCULAR | Status: AC
  Filled 2022-01-26: qty 2

## 2022-01-26 MED ORDER — ROCURONIUM BROMIDE 10 MG/ML (PF) SYRINGE
PREFILLED_SYRINGE | INTRAVENOUS | Status: AC
  Filled 2022-01-26: qty 10

## 2022-01-26 MED ORDER — LIDOCAINE 2% (20 MG/ML) 5 ML SYRINGE
INTRAMUSCULAR | Status: DC | PRN
  Administered 2022-01-26: 60 mg via INTRAVENOUS

## 2022-01-26 MED ORDER — SUGAMMADEX SODIUM 200 MG/2ML IV SOLN
INTRAVENOUS | Status: DC | PRN
  Administered 2022-01-26: 167.8 mg via INTRAVENOUS

## 2022-01-26 MED ORDER — CHLORHEXIDINE GLUCONATE 0.12 % MT SOLN
15.0000 mL | Freq: Once | OROMUCOSAL | Status: AC
  Administered 2022-01-26: 15 mL via OROMUCOSAL

## 2022-01-26 MED ORDER — ACETAMINOPHEN 500 MG PO TABS
1000.0000 mg | ORAL_TABLET | Freq: Four times a day (QID) | ORAL | Status: DC
  Administered 2022-01-26: 1000 mg via ORAL
  Filled 2022-01-26: qty 2

## 2022-01-26 MED ORDER — PHENYLEPHRINE 80 MCG/ML (10ML) SYRINGE FOR IV PUSH (FOR BLOOD PRESSURE SUPPORT)
PREFILLED_SYRINGE | INTRAVENOUS | Status: DC | PRN
  Administered 2022-01-26 (×3): 80 ug via INTRAVENOUS

## 2022-01-26 MED ORDER — 0.9 % SODIUM CHLORIDE (POUR BTL) OPTIME
TOPICAL | Status: DC | PRN
  Administered 2022-01-26: 1000 mL

## 2022-01-26 MED ORDER — ONDANSETRON HCL 4 MG/2ML IJ SOLN
INTRAMUSCULAR | Status: DC | PRN
  Administered 2022-01-26: 4 mg via INTRAVENOUS

## 2022-01-26 MED ORDER — LIDOCAINE HCL (PF) 2 % IJ SOLN
INTRAMUSCULAR | Status: AC
  Filled 2022-01-26: qty 5

## 2022-01-26 MED ORDER — ONDANSETRON HCL 4 MG/2ML IJ SOLN
INTRAMUSCULAR | Status: AC
  Filled 2022-01-26: qty 2

## 2022-01-26 MED ORDER — PROPOFOL 10 MG/ML IV BOLUS
INTRAVENOUS | Status: AC
  Filled 2022-01-26: qty 20

## 2022-01-26 MED ORDER — TRAMADOL HCL 50 MG PO TABS
50.0000 mg | ORAL_TABLET | Freq: Four times a day (QID) | ORAL | Status: DC | PRN
  Administered 2022-01-26: 50 mg via ORAL
  Filled 2022-01-26: qty 1

## 2022-01-26 MED ORDER — AMISULPRIDE (ANTIEMETIC) 5 MG/2ML IV SOLN: 10.0000 mg | Freq: Once | INTRAVENOUS | Status: DC | PRN

## 2022-01-26 MED ORDER — ONDANSETRON HCL 4 MG/2ML IJ SOLN: 4.0000 mg | Freq: Four times a day (QID) | INTRAMUSCULAR | Status: DC | PRN

## 2022-01-26 MED ORDER — BUPIVACAINE HCL (PF) 0.25 % IJ SOLN
INTRAMUSCULAR | Status: DC | PRN
  Administered 2022-01-26: 15 mL

## 2022-01-26 MED ORDER — MIDAZOLAM HCL 5 MG/5ML IJ SOLN
INTRAMUSCULAR | Status: DC | PRN
  Administered 2022-01-26: 2 mg via INTRAVENOUS

## 2022-01-26 MED ORDER — TRAMADOL HCL 50 MG PO TABS: 50.0000 mg | ORAL_TABLET | Freq: Four times a day (QID) | ORAL | 0 refills | Status: AC | PRN

## 2022-01-26 MED ORDER — LACTATED RINGERS IR SOLN
Status: DC | PRN
  Administered 2022-01-26: 1000 mL

## 2022-01-26 MED ORDER — FENTANYL CITRATE PF 50 MCG/ML IJ SOSY
25.0000 ug | PREFILLED_SYRINGE | INTRAMUSCULAR | Status: DC | PRN
  Administered 2022-01-26: 50 ug via INTRAVENOUS

## 2022-01-26 MED ORDER — BUPIVACAINE HCL (PF) 0.25 % IJ SOLN
INTRAMUSCULAR | Status: AC
  Filled 2022-01-26: qty 30

## 2022-01-26 MED ORDER — DEXAMETHASONE SODIUM PHOSPHATE 10 MG/ML IJ SOLN
INTRAMUSCULAR | Status: AC
  Filled 2022-01-26: qty 1

## 2022-01-26 MED ORDER — ROCURONIUM BROMIDE 10 MG/ML (PF) SYRINGE
PREFILLED_SYRINGE | INTRAVENOUS | Status: DC | PRN
  Administered 2022-01-26: 50 mg via INTRAVENOUS

## 2022-01-26 MED ORDER — LACTATED RINGERS IV SOLN: INTRAVENOUS | Status: DC

## 2022-01-26 MED ORDER — DEXAMETHASONE SODIUM PHOSPHATE 10 MG/ML IJ SOLN
INTRAMUSCULAR | Status: DC | PRN
  Administered 2022-01-26: 8 mg via INTRAVENOUS

## 2022-01-26 MED ORDER — PROPOFOL 10 MG/ML IV BOLUS
INTRAVENOUS | Status: DC | PRN
  Administered 2022-01-26: 180 mg via INTRAVENOUS

## 2022-01-26 MED ORDER — ACETAMINOPHEN 500 MG PO TABS: 1000.0000 mg | ORAL_TABLET | Freq: Four times a day (QID) | ORAL | Status: AC | PRN

## 2022-01-26 MED ORDER — DEXTROSE-NACL 5-0.9 % IV SOLN: INTRAVENOUS | Status: DC

## 2022-01-26 MED ORDER — FENTANYL CITRATE (PF) 100 MCG/2ML IJ SOLN
INTRAMUSCULAR | Status: DC | PRN
  Administered 2022-01-26: 100 ug via INTRAVENOUS
  Administered 2022-01-26: 50 ug via INTRAVENOUS

## 2022-01-26 MED ORDER — FENTANYL CITRATE PF 50 MCG/ML IJ SOSY
PREFILLED_SYRINGE | INTRAMUSCULAR | Status: AC
  Filled 2022-01-26: qty 1

## 2022-01-26 SURGICAL SUPPLY — 40 items
ADH SKN CLS APL DERMABOND .7 (GAUZE/BANDAGES/DRESSINGS) ×1
APL PRP STRL LF DISP 70% ISPRP (MISCELLANEOUS) ×1
APPLIER CLIP 5 13 M/L LIGAMAX5 (MISCELLANEOUS)
APR CLP MED LRG 5 ANG JAW (MISCELLANEOUS)
BAG COUNTER SPONGE SURGICOUNT (BAG) IMPLANT
BAG SPEC RTRVL 10 TROC 200 (ENDOMECHANICALS) ×1
BAG SPNG CNTER NS LX DISP (BAG)
CABLE HIGH FREQUENCY MONO STRZ (ELECTRODE) ×1 IMPLANT
CHLORAPREP W/TINT 26 (MISCELLANEOUS) ×1 IMPLANT
CLIP APPLIE 5 13 M/L LIGAMAX5 (MISCELLANEOUS) IMPLANT
CLIP LIGATING HEMO O LOK GREEN (MISCELLANEOUS) ×1 IMPLANT
COVER MAYO STAND XLG (MISCELLANEOUS) ×1 IMPLANT
COVER TRANSDUCER ULTRASND (DRAPES) IMPLANT
DERMABOND ADVANCED .7 DNX12 (GAUZE/BANDAGES/DRESSINGS) ×1 IMPLANT
DRAPE C-ARM 42X120 X-RAY (DRAPES) ×1 IMPLANT
ELECT REM PT RETURN 15FT ADLT (MISCELLANEOUS) ×1 IMPLANT
GAUZE SPONGE 2X2 STRL 8-PLY (GAUZE/BANDAGES/DRESSINGS) ×1 IMPLANT
GLOVE BIO SURGEON STRL SZ7.5 (GLOVE) ×1 IMPLANT
GOWN STRL REUS W/ TWL XL LVL3 (GOWN DISPOSABLE) ×3 IMPLANT
GOWN STRL REUS W/TWL XL LVL3 (GOWN DISPOSABLE) ×3
GRASPER SUT TROCAR 14GX15 (MISCELLANEOUS) IMPLANT
IRRIG SUCT STRYKERFLOW 2 WTIP (MISCELLANEOUS) ×1
IRRIGATION SUCT STRKRFLW 2 WTP (MISCELLANEOUS) ×1 IMPLANT
KIT BASIN OR (CUSTOM PROCEDURE TRAY) ×1 IMPLANT
KIT TURNOVER KIT A (KITS) IMPLANT
NDL INSUFFLATION 14GA 120MM (NEEDLE) ×1 IMPLANT
NEEDLE INSUFFLATION 14GA 120MM (NEEDLE) ×1 IMPLANT
PENCIL SMOKE EVACUATOR (MISCELLANEOUS) IMPLANT
POUCH RETRIEVAL ECOSAC 10 (ENDOMECHANICALS) IMPLANT
POUCH RETRIEVAL ECOSAC 10MM (ENDOMECHANICALS) ×1
SCISSORS LAP 5X35 DISP (ENDOMECHANICALS) ×1 IMPLANT
SET CHOLANGIOGRAPH MIX (MISCELLANEOUS) ×1 IMPLANT
SET TUBE SMOKE EVAC HIGH FLOW (TUBING) ×1 IMPLANT
SLEEVE Z-THREAD 5X100MM (TROCAR) ×1 IMPLANT
SPIKE FLUID TRANSFER (MISCELLANEOUS) ×1 IMPLANT
SUT MNCRL AB 4-0 PS2 18 (SUTURE) ×1 IMPLANT
TOWEL OR 17X26 10 PK STRL BLUE (TOWEL DISPOSABLE) ×1 IMPLANT
TRAY LAPAROSCOPIC (CUSTOM PROCEDURE TRAY) ×1 IMPLANT
TROCAR 11X100 Z THREAD (TROCAR) ×1 IMPLANT
TROCAR Z-THREAD OPTICAL 5X100M (TROCAR) ×1 IMPLANT

## 2022-01-26 NOTE — Anesthesia Procedure Notes (Signed)
Procedure Name: Intubation Date/Time: 01/26/2022 7:59 AM  Performed by: Montel Clock, CRNAPre-anesthesia Checklist: Patient identified, Emergency Drugs available, Suction available, Patient being monitored and Timeout performed Patient Re-evaluated:Patient Re-evaluated prior to induction Oxygen Delivery Method: Circle system utilized Preoxygenation: Pre-oxygenation with 100% oxygen Induction Type: IV induction Ventilation: Mask ventilation without difficulty and Oral airway inserted - appropriate to patient size Laryngoscope Size: Mac and 3 Grade View: Grade I Tube type: Oral Tube size: 7.0 mm Number of attempts: 1 Airway Equipment and Method: Stylet Placement Confirmation: ETT inserted through vocal cords under direct vision, positive ETCO2 and breath sounds checked- equal and bilateral Secured at: 22 cm Tube secured with: Tape Dental Injury: Teeth and Oropharynx as per pre-operative assessment

## 2022-01-26 NOTE — Discharge Instructions (Signed)
[facility]  Please arrive at least 30 min before your appointment to complete your check in paperwork.  If you are unable to arrive 30 min prior to your appointment time we may have to cancel or reschedule you. LAPAROSCOPIC SURGERY: POST OP INSTRUCTIONS Always review your discharge instruction sheet given to you by the facility where your surgery was performed. IF YOU HAVE DISABILITY OR FAMILY LEAVE FORMS, YOU MUST BRING THEM TO THE OFFICE FOR PROCESSING.   DO NOT GIVE THEM TO YOUR DOCTOR.  PAIN CONTROL  First take acetaminophen (Tylenol) AND/or ibuprofen (Advil) to control your pain after surgery.  Follow directions on package.  Taking acetaminophen (Tylenol) and/or ibuprofen (Advil) regularly after surgery will help to control your pain and lower the amount of prescription pain medication you may need.  You should not take more than 4,000 mg (4 grams) of acetaminophen (Tylenol) in 24 hours.  You should not take ibuprofen (Advil), aleve, motrin, naprosyn or other NSAIDS if you have a history of stomach ulcers or chronic kidney disease.  A prescription for pain medication may be given to you upon discharge.  Take your pain medication as prescribed, if you still have uncontrolled pain after taking acetaminophen (Tylenol) or ibuprofen (Advil). Use ice packs to help control pain. If you need a refill on your pain medication, please contact your pharmacy.  They will contact our office to request authorization. Prescriptions will not be filled after 5pm or on week-ends.  HOME MEDICATIONS Take your usually prescribed medications unless otherwise directed.  DIET You should follow a light diet the first few days after arrival home.  Be sure to include lots of fluids daily. Avoid fatty, fried foods.   CONSTIPATION It is common to experience some constipation after surgery and if you are taking pain medication.  Increasing fluid intake and taking a stool softener (such as Colace)  will usually help or prevent this problem from occurring.  A mild laxative (Milk of Magnesia or Miralax) should be taken according to package instructions if there are no bowel movements after 48 hours.  WOUND/INCISION CARE Most patients will experience some swelling and bruising in the area of the incisions.  Ice packs will help.  Swelling and bruising can take several days to resolve.  Unless discharge instructions indicate otherwise, follow guidelines below  STERI-STRIPS - you may remove your outer bandages 48 hours after surgery, and you may shower at that time.  You have steri-strips (small skin tapes) in place directly over the incision.  These strips should be left on the skin for 7-10 days.   DERMABOND/SKIN GLUE - you may shower in 24 hours.  The glue will flake off over the next 2-3 weeks. Any sutures or staples will be removed at the office during your follow-up visit.  ACTIVITIES You may resume regular (light) daily activities beginning the next day--such as daily self-care, walking, climbing stairs--gradually increasing activities as tolerated.  You may have sexual intercourse when it is comfortable.  Refrain from any heavy lifting or straining until approved by your doctor. You may drive when you are no longer taking prescription pain medication, you can comfortably wear a seatbelt, and you can safely maneuver your car and apply brakes.  FOLLOW-UP You should see your doctor in the office for a follow-up appointment approximately 2-3 weeks after your surgery.  You should have been given your post-op/follow-up appointment when your surgery was scheduled.  If you did not receive a post-op/follow-up appointment, make sure   that you call for this appointment within a day or two after you arrive home to insure a convenient appointment time. ° ° °WHEN TO CALL YOUR DOCTOR: °Fever over 101.0 °Inability to urinate °Continued bleeding from incision. °Increased pain, redness, or drainage from the  incision. °Increasing abdominal pain ° °The clinic staff is available to answer your questions during regular business hours.  Please don’t hesitate to call and ask to speak to one of the nurses for clinical concerns.  If you have a medical emergency, go to the nearest emergency room or call 911.  A surgeon from [facility] is always on call at the hospital. °[address], [address], [place], [place]  [address] ? [address] [address], , [place]   [address] °[phone] ? [phone] ? FAX [phone] ° ° ° ° °Managing Your Pain After Surgery Without Opioids ° ° ° °Thank you for participating in our program to help patients manage their pain after surgery without opioids. This is part of our effort to provide you with the best care possible, without exposing you or your family to the risk that opioids pose. ° °What pain can I expect after surgery? °You can expect to have some pain after surgery. This is normal. The pain is typically worse the day after surgery, and quickly begins to get better. °Many studies have found that many patients are able to manage their pain after surgery with Over-the-Counter (OTC) medications such as Tylenol and Motrin. If you have a condition that does not allow you to take Tylenol or Motrin, notify your surgical team. ° °How will I manage my pain? °The best strategy for controlling your pain after surgery is around the clock pain control with Tylenol (acetaminophen) and Motrin (ibuprofen or Advil). Alternating these medications with each other allows you to maximize your pain control. In addition to Tylenol and Motrin, you can use heating pads or ice packs on your incisions to help reduce your pain. ° °How will I alternate your regular strength over-the-counter pain medication? °You will take a dose of pain medication every three hours. °Start by taking 650 mg of Tylenol (2 pills of 325 mg) °3 hours later take 600 mg of Motrin (3 pills of 200 mg) °3 hours after  taking the Motrin take 650 mg of Tylenol °3 hours after that take 600 mg of Motrin. ° ° °- 1 - ° °See example - if your first dose of Tylenol is at 12:00 PM ° ° °12:00 PM Tylenol 650 mg (2 pills of 325 mg)  °3:00 PM Motrin 600 mg (3 pills of 200 mg)  °6:00 PM Tylenol 650 mg (2 pills of 325 mg)  °9:00 PM Motrin 600 mg (3 pills of 200 mg)  °Continue alternating every 3 hours  ° °We recommend that you follow this schedule around-the-clock for at least 3 days after surgery, or until you feel that it is no longer needed. Use the table on the last page of this handout to keep track of the medications you are taking. °Important: °Do not take more than 3000mg of Tylenol or 3200mg of Motrin in a 24-hour period. °Do not take ibuprofen/Motrin if you have a history of bleeding stomach ulcers, severe kidney disease, &/or actively taking a blood thinner ° °What if I still have pain? °If you have pain that is not controlled with the over-the-counter pain medications (Tylenol and Motrin or Advil) you might have what we call “breakthrough” pain. You will receive a prescription   for a small amount of an opioid pain medication such as Oxycodone, Tramadol, or Tylenol with Codeine. Use these opioid pills in the first 24 hours after surgery if you have breakthrough pain. Do not take more than 1 pill every 4-6 hours. ° °If you still have uncontrolled pain after using all opioid pills, don't hesitate to call our staff using the number provided. We will help make sure you are managing your pain in the best way possible, and if necessary, we can provide a prescription for additional pain medication. ° ° °Day 1   ° °Time  °Name of Medication Number of pills taken  °Amount of Acetaminophen  °Pain Level  ° °Comments  °AM PM       °AM PM       °AM PM       °AM PM       °AM PM       °AM PM       °AM PM       °AM PM       °Total Daily amount of Acetaminophen °Do not take more than  3,000 mg per day    ° ° °Day 2   ° °Time  °Name of Medication  Number of pills °taken  °Amount of Acetaminophen  °Pain Level  ° °Comments  °AM PM       °AM PM       °AM PM       °AM PM       °AM PM       °AM PM       °AM PM       °AM PM       °Total Daily amount of Acetaminophen °Do not take more than  3,000 mg per day    ° ° °Day 3   ° °Time  °Name of Medication Number of pills taken  °Amount of Acetaminophen  °Pain Level  ° °Comments  °AM PM       °AM PM       °AM PM       °AM PM       ° ° ° °AM PM       °AM PM       °AM PM       °AM PM       °Total Daily amount of Acetaminophen °Do not take more than  3,000 mg per day    ° ° °Day 4   ° °Time  °Name of Medication Number of pills taken  °Amount of Acetaminophen  °Pain Level  ° °Comments  °AM PM       °AM PM       °AM PM       °AM PM       °AM PM       °AM PM       °AM PM       °AM PM       °Total Daily amount of Acetaminophen °Do not take more than  3,000 mg per day    ° ° °Day 5   ° °Time  °Name of Medication Number °of pills taken  °Amount of Acetaminophen  °Pain Level  ° °Comments  °AM PM       °AM PM       °AM PM       °AM PM       °AM PM       °AM   PM       °AM PM       °AM PM       °Total Daily amount of Acetaminophen °Do not take more than  3,000 mg per day    ° ° ° °Day 6   ° °Time  °Name of Medication Number of pills °taken  °Amount of Acetaminophen  °Pain Level  °Comments  °AM PM       °AM PM       °AM PM       °AM PM       °AM PM       °AM PM       °AM PM       °AM PM       °Total Daily amount of Acetaminophen °Do not take more than  3,000 mg per day    ° ° °Day 7   ° °Time  °Name of Medication Number of pills taken  °Amount of Acetaminophen  °Pain Level  ° °Comments  °AM PM       °AM PM       °AM PM       °AM PM       °AM PM       °AM PM       °AM PM       °AM PM       °Total Daily amount of Acetaminophen °Do not take more than  3,000 mg per day    ° ° ° ° °For additional information about how and where to safely dispose of unused opioid °medications - https://www.morepowerfulnc.org ° °Disclaimer: This document  contains information and/or instructional materials adapted from [facility] for the typical patient with your condition. It does not replace medical advice from your health care provider because your experience may differ from that of the °typical patient. Talk to your health care provider if you have any questions about this °document, your condition or your treatment plan. °Adapted from [facility] ° °

## 2022-01-26 NOTE — Discharge Summary (Signed)
Patient ID: Jo Lee [id number] 06/07/1965 57 y.o.  Admit date: 01/25/2022 Discharge date: 01/26/2022  Admitting Diagnosis: Acute cholecystitis  Discharge Diagnosis Patient Active Problem List   Diagnosis Date Noted   Acute calculous cholecystitis 01/25/2022   Depression 01/25/2022   Arthritis 01/25/2022   Endometriosis 01/25/2022   Panic attack 01/25/2022   Recurrent sinusitis 01/25/2022   Anemia 01/25/2022   Vitamin D deficiency 07/08/2018   Vitamin B12 deficiency 07/08/2018  S/p lap chole  Consultants none  Reason for Admission: 57 year old woman otherwise healthy who had noted worsening abdominal pain with nausea yesterday after lunch. Had some [place] food and margaritas with friends. Felt nauseated and worse. Tried Pepto-Bismol and other medications without much relief. Went over his red alcohol but persistent. Worsening hangover. Cannot lie down flat. Denies any sick contacts. History of endometriosis. No history of bowel obstructions or inflammatory bowel disease. Normally moves her bowels about every other day. Otherwise rather physically active. Based on work symptoms she came to [facility] emergency department. Differential diagnosis not very conclusive but pain in epigastric and right upper quadrant region reach concerned of cholecystitis. CT scan suspicious for as well. Persistent symptoms not relieved. Surgical consultation requested. She is here today with her husband. Normally can walk 1/2-hour without difficulty. No history of cardiac or pulmonary issues. Does not feel comfortable lying down. Discomfort worse. No bad heartburn or reflux. No improvement in symptoms with antiacid medication. History of gastritis or heavy NSAID use. No history hepatitis nor pancreatitis.   Procedures Lap chole, Dr. Rosendo Gros 1/15   Hospital Course:  The patient was admitted and underwent a laparoscopic cholecystectomy.  The patient tolerated the procedure well.  On POD 0, the  patient was tolerating a regular diet, voiding well, mobilizing, and pain was controlled with oral pain medications.  The patient was stable for DC home at this time with appropriate follow up made.  Physical Exam: Abd: soft, appropriately tender, ice pack in place, incisions c/d/i  Allergies as of 01/26/2022       Reactions   Codeine Other (See Comments)   dizziness   Sulfa Antibiotics Hives        Medication List     TAKE these medications    acetaminophen 500 MG tablet Commonly known as: TYLENOL Take 2 tablets (1,000 mg total) by mouth every 6 (six) hours as needed for moderate pain, fever or headache.   calcium carbonate 1250 (500 Ca) MG chewable tablet Commonly known as: OS-CAL Chew 1 tablet by mouth daily.   cholecalciferol 25 MCG (1000 UNIT) tablet Commonly known as: VITAMIN D3 Take 1,000 Units by mouth daily.   estradiol 0.0375 mg/24hr patch Commonly known as: CLIMARA - Dosed in mg/24 hr Place 0.0375 mg onto the skin once a week.   Melatonin 5 MG Chew Chew 5 mg by mouth at bedtime.   mirabegron ER 50 MG Tb24 tablet Commonly known as: MYRBETRIQ Take 1 tablet (50 mg total) by mouth daily.   multivitamin with minerals Tabs tablet Take 1 tablet by mouth daily.   nitrofurantoin 100 MG capsule Commonly known as: Macrodantin Take 1 capsule (100 mg total) by mouth daily.   traMADol 50 MG tablet Commonly known as: ULTRAM Take 1 tablet (50 mg total) by mouth every 6 (six) hours as needed (mild pain).   vitamin B-12 500 MCG tablet Commonly known as: CYANOCOBALAMIN Take 2,500 mcg by mouth daily.          Follow-up Information  Maczis, Puja Gosai, PA-C Follow up in 3 week(s).   Specialty: General Surgery Why: Office will call you with a follow up appointment, If you don't hear from the office, please call, Arrive 30 minutes prior to your appointment time, Please bring your insurance card and photo ID Contact information: [address]  [place] [address] [phone]                 Signed: Saverio Danker, [facility] 01/26/2022, 2:16 PM Please see Amion for pager number during day hours 7:00am-4:30pm, 7-11:30am on Weekends

## 2022-01-26 NOTE — Progress Notes (Signed)
  Transition of Care [facility]) Screening Note   Patient Details  Name: Jo Lee Date of Birth: [birth year]   Transition of Care [facility]) CM/SW Contact:    Vassie Moselle, LCSW Phone Number: 01/26/2022, 2:30 PM    Transition of Care Department [facility]) has reviewed patient and no TOC needs have been identified at this time. We will continue to monitor patient advancement through interdisciplinary progression rounds. If new patient transition needs arise, please place a TOC consult.

## 2022-01-26 NOTE — Transfer of Care (Signed)
Immediate Anesthesia Transfer of Care Note  Patient: Jo Lee  Procedure(s) Performed: LAPAROSCOPIC CHOLECYSTECTOMY  Patient Location: PACU  Anesthesia Type:General  Level of Consciousness: drowsy and patient cooperative  Airway & Oxygen Therapy: Patient Spontanous Breathing and Patient connected to face mask oxygen  Post-op Assessment: Report given to RN and Post -op Vital signs reviewed and stable  Post vital signs: Reviewed and stable  Last Vitals:  Vitals Value Taken Time  BP 151/82 01/26/22 0900  Temp    Pulse 56 01/26/22 0902  Resp 12 01/26/22 0902  SpO2 100 % 01/26/22 0902  Vitals shown include unvalidated device data.  Last Pain:  Vitals:   01/26/22 0642  TempSrc: Oral  PainSc: 0-No pain         Complications: No notable events documented.

## 2022-01-26 NOTE — Progress Notes (Signed)
Day of Surgery   Subjective/Chief Complaint: PT with no pain this AM   Objective: Vital signs in last 24 hours: Temp:  [97.7 F (36.5 C)-98.7 F (37.1 C)] 98.2 F (36.8 C) (01/15 0642) Pulse Rate:  [65-87] 79 (01/15 0642) Resp:  [16-19] 16 (01/15 0642) BP: (99-169)/(68-107) 113/79 (01/15 0642) SpO2:  [96 %-100 %] 96 % (01/15 0642) Weight:  [83.9 kg] 83.9 kg (01/14 0813) Last BM Date : 01/25/22  Intake/Output from previous day: 01/14 0701 - 01/15 0700 In: 1851.7 [I.V.:751.7; IV Piggyback:1100] Out: -  Intake/Output this shift: No intake/output data recorded.  PE:  Constitutional: No acute distress, conversant, appears states age. Eyes: Anicteric sclerae, moist conjunctiva, no lid lag Lungs: Clear to auscultation bilaterally, normal respiratory effort CV: regular rate and rhythm, no murmurs, no peripheral edema, pedal pulses 2+ GI: Soft, no masses or hepatosplenomegaly, non-tender to palpation Skin: No rashes, palpation reveals normal turgor Psychiatric: appropriate judgment and insight, oriented to person, place, and time   Lab Results:  Recent Labs    01/25/22 1002  WBC 9.1  HGB 13.5  HCT 40.3  PLT 173   BMET Recent Labs    01/25/22 1002  NA 137  K 4.3  CL 99  CO2 27  GLUCOSE 131*  BUN 13  CREATININE 0.72  CALCIUM 9.3   PT/INR No results for input(s): "LABPROT", "INR" in the last 72 hours. ABG No results for input(s): "PHART", "HCO3" in the last 72 hours.  Invalid input(s): "PCO2", "PO2"  Studies/Results: US Abdomen Limited RUQ (LIVER/GB)  Result Date: 01/25/2022 CLINICAL DATA:  Right upper quadrant pain EXAM: ULTRASOUND ABDOMEN LIMITED RIGHT UPPER QUADRANT COMPARISON:  CT abdomen pelvis 01/25/2022 FINDINGS: Gallbladder: There is a 2.7 cm stone at the gallbladder neck. And additional 1.8 cm per mobile stone. Borderline gallbladder wall thickening. Negative sonographic Murphy sign. No significant pericholecystic fluid. Common bile duct: Diameter:  0.4 cm, within normal limits Liver: No focal lesion identified. Within normal limits in parenchymal echogenicity. Portal vein is patent on color Doppler imaging with normal direction of blood flow towards the liver. Other: None. IMPRESSION: Cholelithiasis with borderline gallbladder wall thickening, negative sonographic Murphy sign and no significant pericholecystic fluid. Findings are indeterminate for acute cholecystitis. Electronically Signed   By: Audie Pinto M.D.   On: 01/25/2022 13:12   CT Abdomen Pelvis W Contrast  Result Date: 01/25/2022 CLINICAL DATA:  Pancreatitis, acute, severe. Epigastric abdominal pain. EXAM: CT ABDOMEN AND PELVIS WITH CONTRAST TECHNIQUE: Multidetector CT imaging of the abdomen and pelvis was performed using the standard protocol following bolus administration of intravenous contrast. RADIATION DOSE REDUCTION: This exam was performed according to the departmental dose-optimization program which includes automated exposure control, adjustment of the mA and/or kV according to patient size and/or use of iterative reconstruction technique. CONTRAST:  133mL OMNIPAQUE IOHEXOL 300 MG/ML  SOLN COMPARISON:  One-view abdomen 07/21/2019 FINDINGS: Lower chest: Mild dependent atelectasis is present bilaterally. The lungs are otherwise clear. The heart size is normal. No significant pleural or pericardial effusion is present. Hepatobiliary: Mild periportal edema present. The common bile duct is within normal limits size. Inflammatory changes are present about the gallbladder neck. A cholesterol stone is present in the fundus of the gallbladder measuring up to 3.3 cm. Free fluid surrounds the neck of the gallbladder. Pancreas: Unremarkable. No pancreatic ductal dilatation or surrounding inflammatory changes. Spleen: Normal in size without focal abnormality. Adrenals/Urinary Tract: Adrenal glands are normal bilaterally. Kidneys are unremarkable. No stone or mass lesion is present. No  obstruction is present. The ureters are within normal limits bilaterally. The urinary bladder is normal. Stomach/Bowel: The stomach and duodenum are within normal limits. The small bowel is unremarkable. Terminal ileum is within normal limits. The appendix is visualized and normal. The ascending and transverse colon are within normal limits. The descending and sigmoid colon are normal. Vascular/Lymphatic: No significant vascular findings are present. No enlarged abdominal or pelvic lymph nodes. Reproductive: Uterus and bilateral adnexa are unremarkable. Other: No abdominal wall hernia or abnormality. No abdominopelvic ascites. Musculoskeletal: Vertebral body heights and alignment are normal. Degenerative endplate changes are present at L4-5 asymmetric on the left. Bony pelvis is within normal limits. Hips are located and within normal limits bilaterally. IMPRESSION: 1. Inflammatory changes about the gallbladder neck with a 3.3 cm cholesterol stone in the fundus of the gallbladder. Findings are concerning acute cholecystitis. Right upper quadrant ultrasound may be useful for further evaluation if clinically indicated. 2. Normal CT appearance of the pancreas. 3. No other acute or focal lesion to explain the patient's epigastric pain. Electronically Signed   By: San Morelle M.D.   On: 01/25/2022 12:12   DG Chest Portable 1 View  Result Date: 01/25/2022 CLINICAL DATA:  Chest pain EXAM: PORTABLE CHEST 1 VIEW COMPARISON:  04/12/2016 FINDINGS: The heart size and mediastinal contours are within normal limits. Both lungs are clear. The visualized skeletal structures are unremarkable. IMPRESSION: No active disease. Electronically Signed   By: Davina Poke D.O.   On: 01/25/2022 11:22    Anti-infectives: Anti-infectives (From admission, onward)    Start     Dose/Rate Route Frequency Ordered Stop   01/25/22 2200  [MAR Hold]  cefTRIAXone (ROCEPHIN) 2 g in sodium chloride 0.9 % 100 mL IVPB        (MAR Hold  since Mon 01/26/2022 at 0629.Hold Reason: Transfer to a Procedural area)   2 g 200 mL/hr over 30 Minutes Intravenous Every 24 hours 01/25/22 1536 02/01/22 2159   01/25/22 1230  cefTRIAXone (ROCEPHIN) 1 g in sodium chloride 0.9 % 100 mL IVPB        1 g 200 mL/hr over 30 Minutes Intravenous  Once 01/25/22 1223 01/25/22 1338   01/25/22 1230  metroNIDAZOLE (FLAGYL) IVPB 500 mg        500 mg 100 mL/hr over 60 Minutes Intravenous  Once 01/25/22 1223 01/25/22 1502       Assessment/Plan: 31F with acute cholecystitis To OR for lap chole All risks and benefits were discussed with the patient to generally include: infection, bleeding, possible need for post op ERCP, damage to the bile ducts, and bile leak. Alternatives were offered and described.  All questions were answered and the patient voiced understanding of the procedure and wishes to proceed at this point with a laparoscopic cholecystectomy   LOS: 1 day    Ralene Ok 01/26/2022

## 2022-01-26 NOTE — Anesthesia Preprocedure Evaluation (Signed)
Anesthesia Evaluation  Patient identified by MRN, date of birth, ID band Patient awake    Reviewed: Allergy & Precautions, NPO status , Patient's Chart, lab work & pertinent test results  Airway Mallampati: I  TM Distance: >3 FB Neck ROM: Full    Dental  (+) Dental Advisory Given   Pulmonary neg pulmonary ROS   breath sounds clear to auscultation       Cardiovascular negative cardio ROS  Rhythm:Regular Rate:Normal     Neuro/Psych negative neurological ROS     GI/Hepatic negative GI ROS, Neg liver ROS,,,  Endo/Other  negative endocrine ROS    Renal/GU negative Renal ROS     Musculoskeletal   Abdominal   Peds  Hematology negative hematology ROS (+)   Anesthesia Other Findings   Reproductive/Obstetrics                             Anesthesia Physical Anesthesia Plan  ASA: 2  Anesthesia Plan: General   Post-op Pain Management: Tylenol PO (pre-op)* and Toradol IV (intra-op)*   Induction: Intravenous  PONV Risk Score and Plan: 3 and Dexamethasone, Ondansetron, Midazolam and Treatment may vary due to age or medical condition  Airway Management Planned: Oral ETT  Additional Equipment:   Intra-op Plan:   Post-operative Plan: Extubation in OR  Informed Consent: I have reviewed the patients History and Physical, chart, labs and discussed the procedure including the risks, benefits and alternatives for the proposed anesthesia with the patient or authorized representative who has indicated his/her understanding and acceptance.     Dental advisory given  Plan Discussed with:   Anesthesia Plan Comments:        Anesthesia Quick Evaluation

## 2022-01-26 NOTE — Op Note (Signed)
01/26/2022  8:54 AM  PATIENT:  Jo Lee  57 y.o. female  PRE-OPERATIVE DIAGNOSIS:  ACUTE CHOLECYSTITIS  POST-OPERATIVE DIAGNOSIS:  ACUTE CHOLECYSTITIS, CHOLELITHIASIS  PROCEDURE:  Procedure(s): LAPAROSCOPIC CHOLECYSTECTOMY (N/A)  SURGEON:  Surgeon(s) and Role:    Ralene Ok, MD - Primary  ANESTHESIA:   local and general  EBL:  20 mL   BLOOD ADMINISTERED:none  DRAINS: none   LOCAL MEDICATIONS USED:  BUPIVICAINE   SPECIMEN:  Source of Specimen:  GALLBLADDER  DISPOSITION OF SPECIMEN:  PATHOLOGY  COUNTS:  YES  TOURNIQUET:  * No tourniquets in log *  DICTATION:  Findings: chronically inflamed and edematous gallbladder with large gallstones  .Dragon Dictation The patient was taken to the operating and placed in the supine position with bilateral SCDs in place.  The patient was prepped and draped in the usual sterile fashion. A time out was called and all facts were verified. A pneumoperitoneum was obtained via A Veress needle technique to a pressure of 10mm of mercury.  A 64mm trochar was then placed in the right upper quadrant under visualization, and there were no injuries to any abdominal organs. A 11 mm port was then placed in the umbilical region after infiltrating with local anesthesia under direct visualization. A second and third epigastric port and right lower quadrant port placement under direct visualization, respectively.    The gallbladder was identified and retracted, the peritoneum was then sharply dissected from the gallbladder and this dissection was carried down to Calot's triangle. The cystic duct was identified and stripped away circumferentially and seen going into the gallbladder 360, the critical angle was obtained.  2 clips were placed proximally one distally and the cystic duct transected. The cystic artery was identified and 2 clips placed proximally and one distally and transected.  We then proceeded to remove the gallbladder off the hepatic  fossa with Bovie cautery. A retrieval bag was then placed in the abdomen and gallbladder placed in the bag. The hepatic fossa was then reexamined and hemostasis was achieved with Bovie cautery and was excellent at the end of the case.   The subhepatic fossa and perihepatic fossa was then irrigated until the effluent was clear.  The gallbladder and bag were removed from the abdominal cavity. The 11 mm trocar fascia was reapproximated with the Endo Close #1 Vicryl x3.  The pneumoperitoneum was evacuated and all trochars removed under direct visulalization.  The skin was then closed with 4-0 Monocryl and the skin dressed with Dermabond.    The patient was awaken from general anesthesia and taken to the recovery room in stable condition.    PLAN OF CARE: Admit for overnight observation  PATIENT DISPOSITION:  PACU - hemodynamically stable.   Delay start of Pharmacological VTE agent (>24hrs) due to surgical blood loss or risk of bleeding: not applicable

## 2022-01-27 ENCOUNTER — Encounter (HOSPITAL_COMMUNITY): Payer: Self-pay | Admitting: General Surgery

## 2022-01-27 NOTE — Anesthesia Postprocedure Evaluation (Signed)
Anesthesia Post Note  Patient: Jo Lee  Procedure(s) Performed: LAPAROSCOPIC CHOLECYSTECTOMY     Patient location during evaluation: PACU Anesthesia Type: General Level of consciousness: awake and alert Pain management: pain level controlled Vital Signs Assessment: post-procedure vital signs reviewed and stable Respiratory status: spontaneous breathing, nonlabored ventilation, respiratory function stable and patient connected to nasal cannula oxygen Cardiovascular status: blood pressure returned to baseline and stable Postop Assessment: no apparent nausea or vomiting Anesthetic complications: no   No notable events documented.  Last Vitals:  Vitals:   01/26/22 1005 01/26/22 1425  BP: (!) 132/91 134/83  Pulse: (!) 54 68  Resp: 18 16  Temp:  36.6 C  SpO2: 100% 94%    Last Pain:  Vitals:   01/26/22 1425  TempSrc: Oral  PainSc:                  [name]

## 2022-01-28 LAB — SURGICAL PATHOLOGY

## 2022-02-02 ENCOUNTER — Other Ambulatory Visit: Payer: Self-pay | Admitting: Internal Medicine

## 2022-02-02 DIAGNOSIS — N63 Unspecified lump in unspecified breast: Secondary | ICD-10-CM

## 2022-02-13 ENCOUNTER — Ambulatory Visit
Admission: RE | Admit: 2022-02-13 | Discharge: 2022-02-13 | Disposition: A | Discharge status: Home / Self Care | Payer: BC Managed Care – PPO | Source: Ambulatory Visit | Attending: Internal Medicine | Admitting: Internal Medicine

## 2022-02-13 DIAGNOSIS — N63 Unspecified lump in unspecified breast: Secondary | ICD-10-CM | POA: Insufficient documentation

## 2022-02-18 ENCOUNTER — Other Ambulatory Visit: Payer: Self-pay | Admitting: Internal Medicine

## 2022-02-18 DIAGNOSIS — N63 Unspecified lump in unspecified breast: Secondary | ICD-10-CM

## 2022-02-18 DIAGNOSIS — R928 Other abnormal and inconclusive findings on diagnostic imaging of breast: Secondary | ICD-10-CM

## 2022-02-19 ENCOUNTER — Ambulatory Visit
Admission: RE | Admit: 2022-02-19 | Discharge: 2022-02-19 | Disposition: A | Discharge status: Home / Self Care | Payer: BC Managed Care – PPO | Source: Ambulatory Visit | Attending: Internal Medicine | Admitting: Internal Medicine

## 2022-02-19 DIAGNOSIS — N63 Unspecified lump in unspecified breast: Secondary | ICD-10-CM | POA: Insufficient documentation

## 2022-02-19 DIAGNOSIS — R928 Other abnormal and inconclusive findings on diagnostic imaging of breast: Secondary | ICD-10-CM

## 2022-02-19 HISTORY — PX: BREAST BIOPSY: SHX20

## 2022-02-19 MED ORDER — LIDOCAINE HCL (PF) 1 % IJ SOLN
2.0000 mL | Freq: Once | INTRAMUSCULAR | Status: AC
  Administered 2022-02-19: 2 mL

## 2022-02-19 MED ORDER — LIDOCAINE-EPINEPHRINE 1 %-1:100000 IJ SOLN
8.0000 mL | Freq: Once | INTRAMUSCULAR | Status: AC
  Administered 2022-02-19: 8 mL

## 2022-02-23 LAB — SURGICAL PATHOLOGY

## 2022-08-24 IMAGING — MG MM DIGITAL SCREENING BILAT W/ TOMO AND CAD
6 of 10 series · 6 of 30 positions shown · non-contrast
Comparison: Previous exam(s).

CLINICAL DATA: Screening.

EXAM:
DIGITAL SCREENING BILATERAL MAMMOGRAM WITH TOMOSYNTHESIS AND CAD
TECHNIQUE: Bilateral screening digital craniocaudal and mediolateral oblique
mammograms were obtained. Bilateral screening digital breast
tomosynthesis was performed. The images were evaluated with
computer-aided detection.

[L MLO synth-2D]
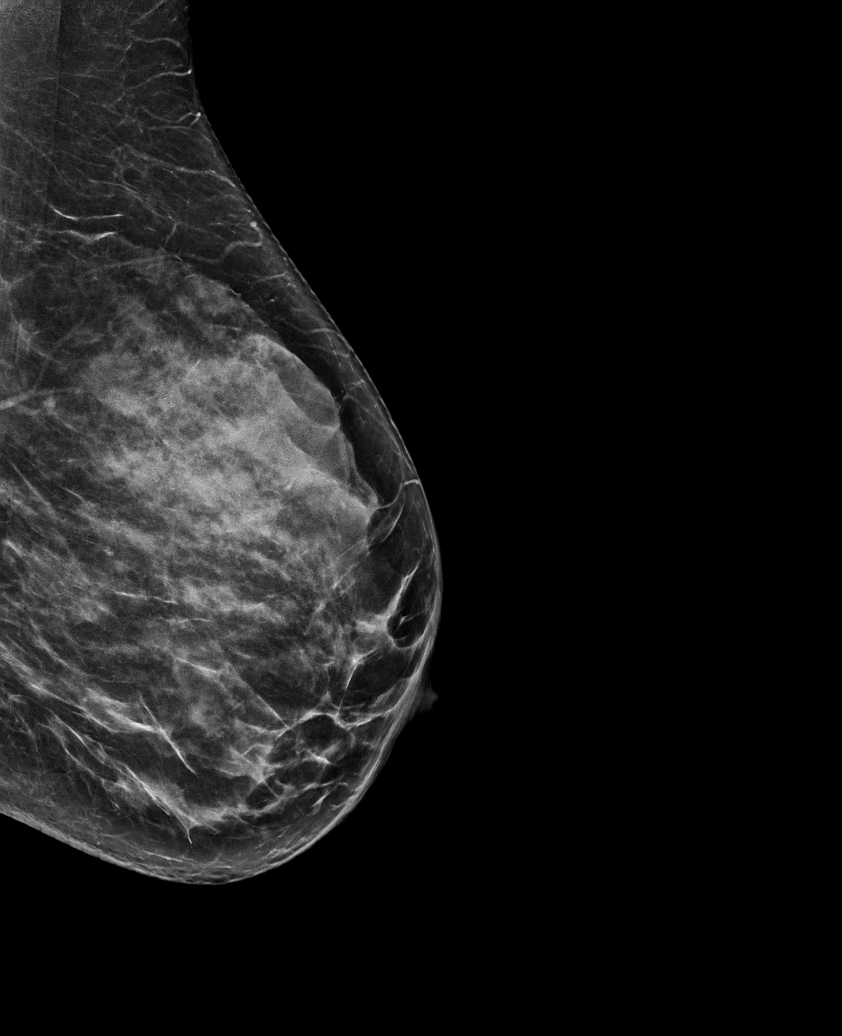

[R MLO synth-2D (1 of 2)]
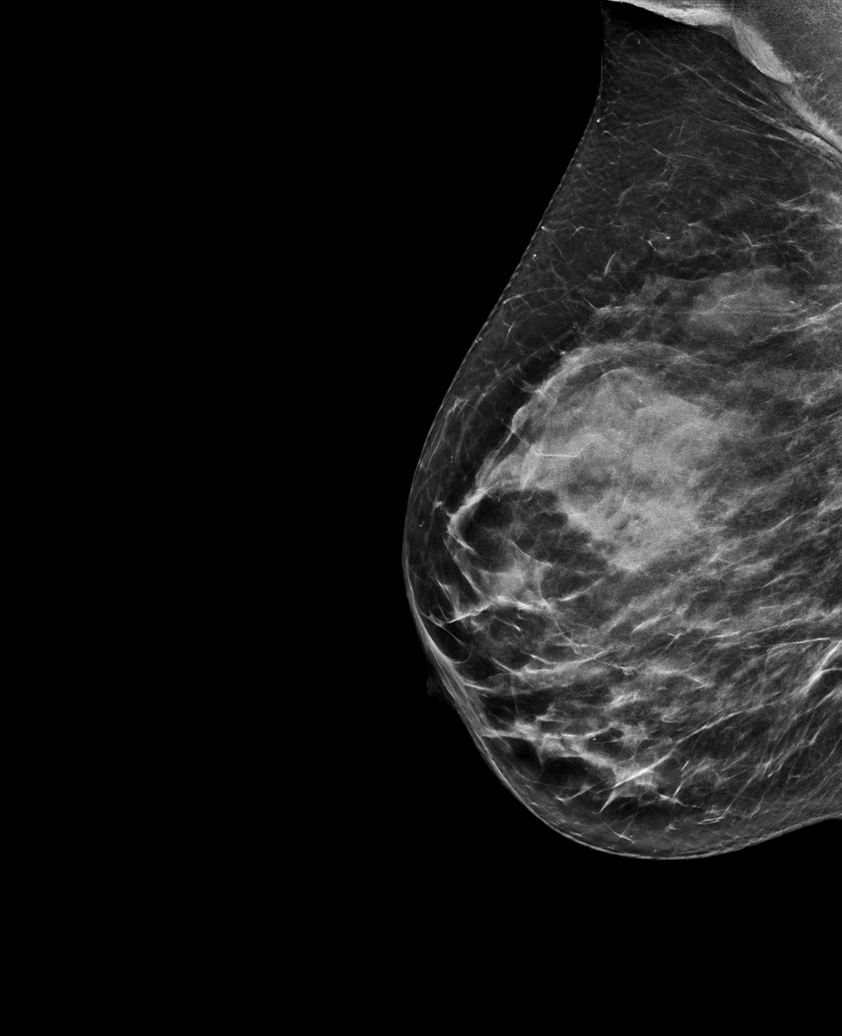

[R CC synth-2D]
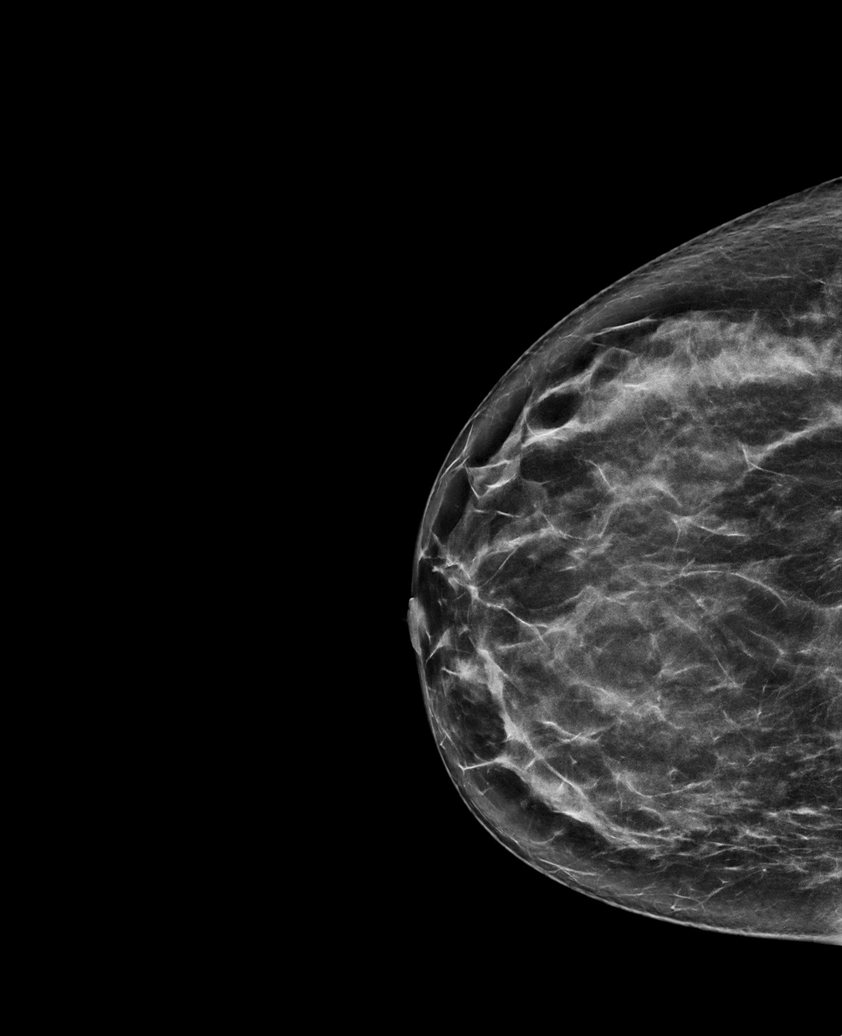

[R MLO synth-2D (2 of 2)]
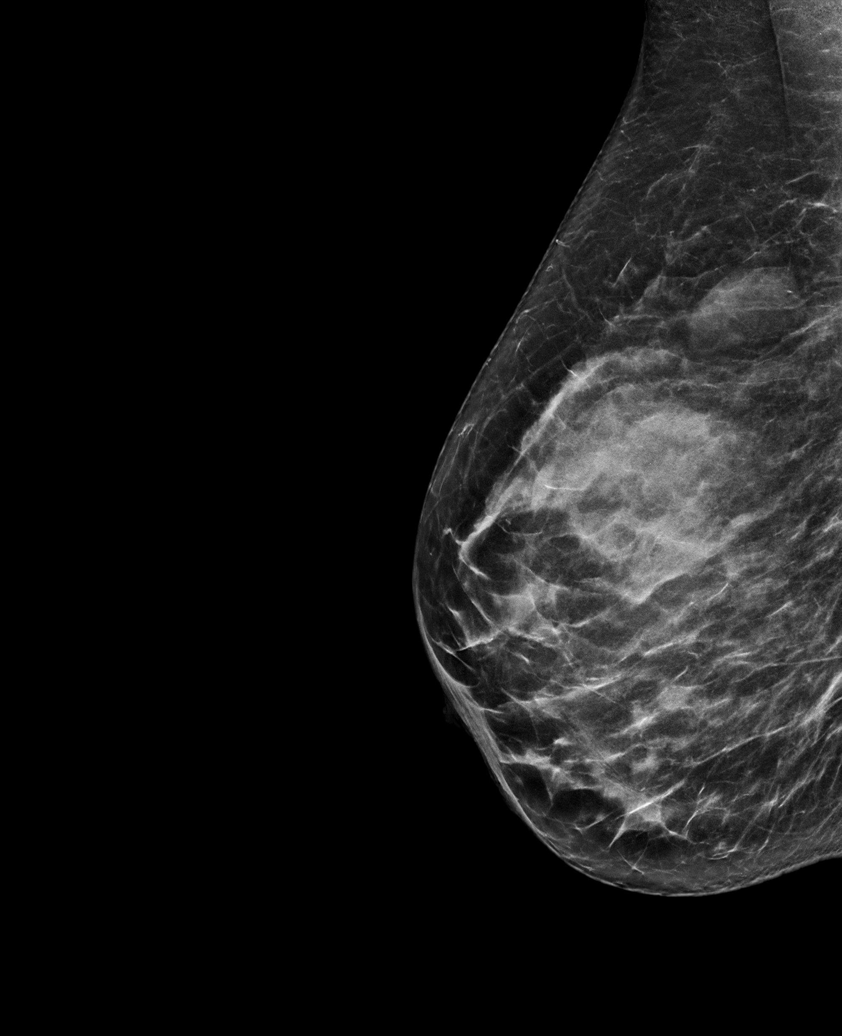

[L CC synth-2D]
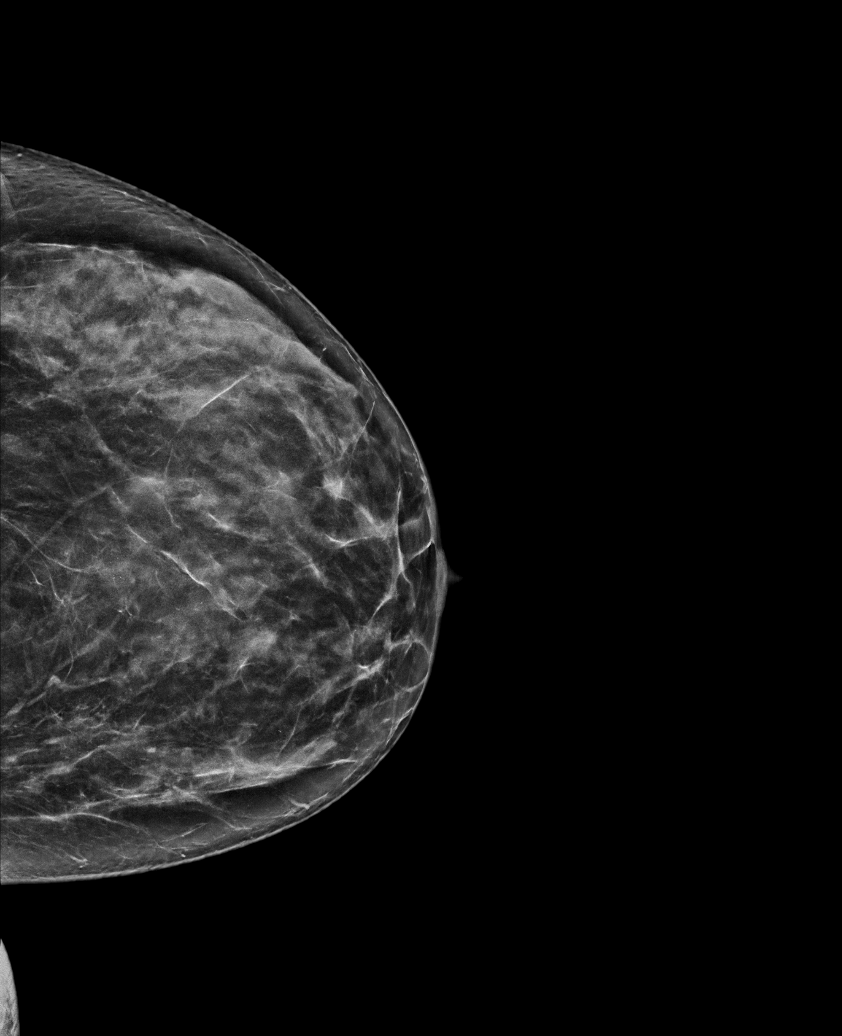

[R MLO tomo · tomo slice 34/67.0]
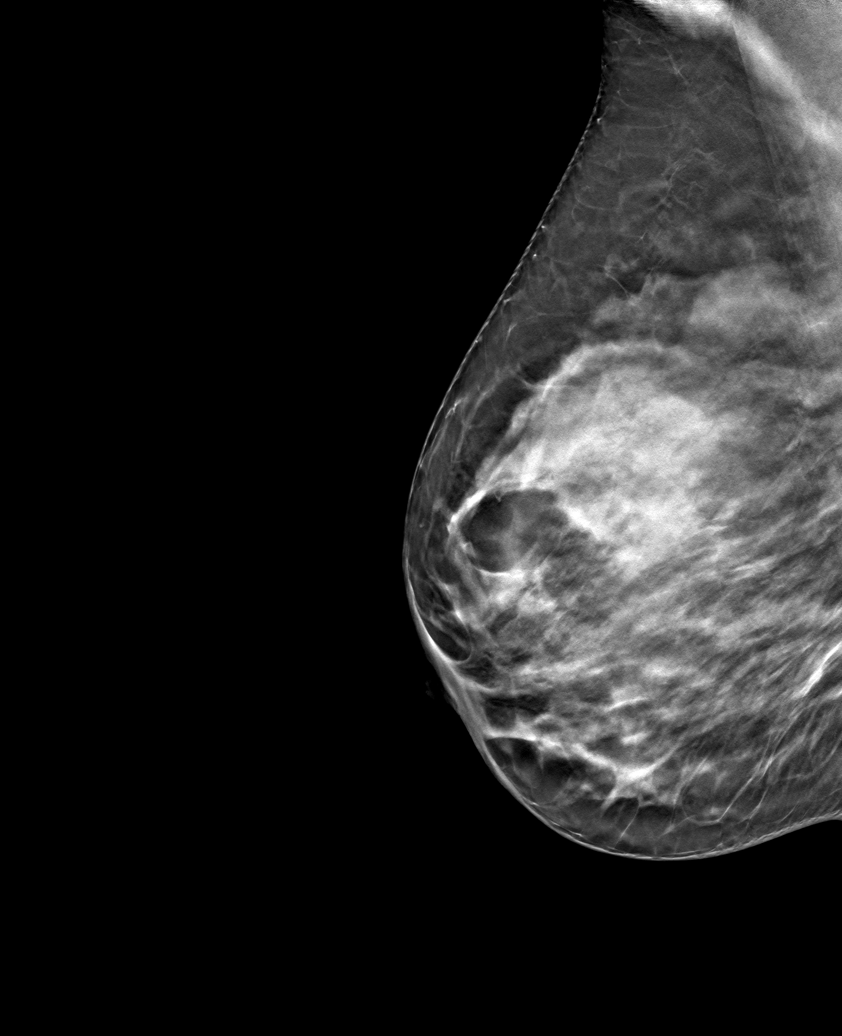

[6 of 30 positions shown; findings below may reference images not displayed]

ACR Breast Density Category c: The breast tissue is heterogeneously
dense, which may obscure small masses.
FINDINGS: There are no findings suspicious for malignancy. The images were
evaluated with computer-aided detection.
IMPRESSION: No mammographic evidence of malignancy. A result letter of this
screening mammogram will be mailed directly to the patient.

RECOMMENDATION:
Screening mammogram in one year. (Code:T4-5-GWO)

BI-RADS CATEGORY  1: Negative.
